# Patient Record
Sex: Female | Born: 1985 | Race: Black or African American | Hispanic: No | Marital: Single | Smoking: Former smoker
Health system: Southern US, Community
[De-identification: ages and names within clinical notes are randomized; demographics above are authoritative.]

## PROBLEM LIST (undated history)

## (undated) ENCOUNTER — Inpatient Hospital Stay (HOSPITAL_COMMUNITY): Payer: Self-pay

## (undated) DIAGNOSIS — F419 Anxiety disorder, unspecified: Secondary | ICD-10-CM

## (undated) DIAGNOSIS — F32A Depression, unspecified: Secondary | ICD-10-CM

## (undated) DIAGNOSIS — D649 Anemia, unspecified: Secondary | ICD-10-CM

## (undated) DIAGNOSIS — F329 Major depressive disorder, single episode, unspecified: Secondary | ICD-10-CM

## (undated) DIAGNOSIS — Z789 Other specified health status: Secondary | ICD-10-CM

## (undated) DIAGNOSIS — N83209 Unspecified ovarian cyst, unspecified side: Secondary | ICD-10-CM

## (undated) HISTORY — PX: HERNIA REPAIR: SHX51

---

## 2010-02-14 ENCOUNTER — Emergency Department (HOSPITAL_COMMUNITY): Admission: EM | Admit: 2010-02-14 | Discharge: 2010-02-14 | Payer: Self-pay | Admitting: Emergency Medicine

## 2010-08-14 ENCOUNTER — Emergency Department (HOSPITAL_COMMUNITY): Admission: EM | Admit: 2010-08-14 | Discharge: 2010-08-15 | Payer: Self-pay | Admitting: Emergency Medicine

## 2010-10-15 ENCOUNTER — Emergency Department (HOSPITAL_COMMUNITY)
Admission: EM | Admit: 2010-10-15 | Discharge: 2010-10-15 | Payer: Self-pay | Source: Home / Self Care | Admitting: Emergency Medicine

## 2010-10-23 ENCOUNTER — Emergency Department (HOSPITAL_COMMUNITY)
Admission: EM | Admit: 2010-10-23 | Discharge: 2010-10-23 | Payer: Self-pay | Source: Home / Self Care | Admitting: Family Medicine

## 2011-01-23 LAB — POCT PREGNANCY, URINE: Preg Test, Ur: NEGATIVE

## 2011-01-29 LAB — COMPREHENSIVE METABOLIC PANEL
ALT: 24 U/L (ref 0–35)
AST: 16 U/L (ref 0–37)
Albumin: 3.6 g/dL (ref 3.5–5.2)
CO2: 27 mEq/L (ref 19–32)
Creatinine, Ser: 0.87 mg/dL (ref 0.4–1.2)
GFR calc non Af Amer: >60 mL/min (ref >60)
Glucose, Bld: 93 mg/dL (ref 70–99)
Potassium: 3.4 mEq/L — ABNORMAL LOW (ref 3.5–5.1)
Total Protein: 7.8 g/dL (ref 6.0–8.3)

## 2011-01-29 LAB — DIFFERENTIAL
Basophils Absolute: 0.1 10*3/uL (ref 0.0–0.1)
Basophils Relative: 1 % (ref 0–1)
Eosinophils Absolute: 0.1 10*3/uL (ref 0.0–0.7)
Neutrophils Relative %: 75 % (ref 43–77)

## 2011-01-29 LAB — CBC
HCT: 38.6 % (ref 36.0–46.0)
Hemoglobin: 12.1 g/dL (ref 12.0–15.0)
MCHC: 31.3 g/dL (ref 30.0–36.0)
MCV: 75.4 fL — ABNORMAL LOW (ref 78.0–100.0)
Platelets: 419 10*3/uL — ABNORMAL HIGH (ref 150–400)
RBC: 5.12 MIL/uL — ABNORMAL HIGH (ref 3.87–5.11)
WBC: 15.3 10*3/uL — ABNORMAL HIGH (ref 4.0–10.5)

## 2011-01-29 LAB — URINALYSIS, ROUTINE W REFLEX MICROSCOPIC
Glucose, UA: NEGATIVE mg/dL
Hgb urine dipstick: NEGATIVE
Nitrite: POSITIVE — AB
pH: 5.5 (ref 5.0–8.0)

## 2012-08-24 ENCOUNTER — Emergency Department (HOSPITAL_COMMUNITY)
Admission: EM | Admit: 2012-08-24 | Discharge: 2012-08-24 | Disposition: A | Discharge status: Home / Self Care | Payer: Medicaid Other | Attending: Emergency Medicine | Admitting: Emergency Medicine

## 2012-08-24 ENCOUNTER — Encounter (HOSPITAL_COMMUNITY): Payer: Self-pay | Admitting: Emergency Medicine

## 2012-08-24 ENCOUNTER — Emergency Department (HOSPITAL_COMMUNITY): Payer: Medicaid Other

## 2012-08-24 DIAGNOSIS — M25561 Pain in right knee: Secondary | ICD-10-CM

## 2012-08-24 DIAGNOSIS — M25569 Pain in unspecified knee: Secondary | ICD-10-CM | POA: Insufficient documentation

## 2012-08-24 MED ORDER — OXYCODONE-ACETAMINOPHEN 5-325 MG PO TABS
2.0000 | ORAL_TABLET | Freq: Once | ORAL | Status: AC
  Administered 2012-08-24: 2 via ORAL
  Filled 2012-08-24: qty 2

## 2012-08-24 MED ORDER — OXYCODONE-ACETAMINOPHEN 5-325 MG PO TABS: 2.0000 | ORAL_TABLET | Freq: Four times a day (QID) | ORAL | Status: DC | PRN

## 2012-08-24 NOTE — ED Notes (Signed)
Pt c/o right knee pain after standing up and hearing pop; pt sts still having pain

## 2012-08-24 NOTE — ED Notes (Signed)
Ortho paged for crutches and immobilizer 

## 2012-08-24 NOTE — Progress Notes (Signed)
Orthopedic Tech Progress Note Patient Details:  Shawna Hunt 11-Oct-1986 [id number]  Ortho Devices Type of Ortho Device: Crutches;Knee Immobilizer Ortho Device/Splint Location: (R) LE Ortho Device/Splint Interventions: Ordered;Application   [name] 08/24/2012, 5:30 PM

## 2012-08-24 NOTE — ED Provider Notes (Signed)
History     CSN: [id number]  Arrival date & time 08/24/12  1426   First MD Initiated Contact with Patient 08/24/12 1628      Chief Complaint  Patient presents with  . Knee Pain    (Consider location/radiation/quality/duration/timing/severity/associated sxs/prior treatment) HPI This 26 year old female yesterday was getting up after sitting on the floor and felt a pop and pain in her right knee. There is no swelling or color change to her right leg but she has pain from the right knees which is severe worse with position changes and radiates towards her right thigh and right lower leg as well. She is no numbness or weakness or color change or swelling to right foot. She is able fully extend the leg against resistance but has decreased flexion due to the pain. She has a feeling like he is going to give way when she tries to stand and walk with a limp. She is no other injury there was no fall. There is no other pain or other concerns today. She is no pain to her neck back chest or abdomen. She is no shortness breath. She is no pain to her left leg her arms. There is no treatment prior to arrival. Her pain is sharp stabbing worse with position changes better she stays still. History reviewed. No pertinent past medical history.  History reviewed. No pertinent past surgical history.  History reviewed. No pertinent family history.  History  Substance Use Topics  . Smoking status: Current Every Day Smoker  . Smokeless tobacco: Not on file  . Alcohol Use: No    OB History    Grav Para Term Preterm Abortions TAB SAB Ect Mult Living                  Review of Systems 10 Systems reviewed and are negative for acute change except as noted in the HPI. Allergies  Review of patient's allergies indicates no known allergies.  Home Medications   Current Outpatient Rx  Name Route Sig Dispense Refill  . OXYCODONE-ACETAMINOPHEN 5-325 MG PO TABS Oral Take 2 tablets by mouth every 6 (six) hours  as needed for pain. 20 tablet 0    BP 102/68  Pulse 107  Temp 98.6 F (37 C) (Oral)  Resp 18  SpO2 98%  LMP 08/10/2012  Physical Exam  Nursing note and vitals reviewed. Constitutional:       Awake, alert, nontoxic appearance.  HENT:  Head: Atraumatic.  Eyes: Right eye exhibits no discharge. Left eye exhibits no discharge.  Neck: Neck supple.  Pulmonary/Chest: Effort normal. She exhibits no tenderness.  Abdominal: Soft. There is no tenderness. There is no rebound.  Musculoskeletal: She exhibits tenderness.       Baseline ROM, no obvious new focal weakness. Both arms and left leg nontender. Cervical spine nontender. Right leg has mild diffuse tenderness to the thigh knee and calf without swelling or deformity noted. Right foot his dorsalis pedis pulse intact. Right foot is normal light touch with capillary refill less than 2 seconds. Right foot is good movement and strength. Right knee has diffuse tenderness with negative patellar apprehension test and the patient is able to extend the knee completely against resistance. Patient has decreased flexion due to pain. Patient has negative Lachman's and no laxity or worsening pain with varus or valgus stress testing. Patient does have abnormal positive McMurray's testing. Based on the history is most suspicious of meniscal tear.  Neurological:       Mental  status and motor strength appears baseline for patient and situation.  Skin: No rash noted.  Psychiatric: She has a normal mood and affect.    ED Course  Procedures (including critical care time)  Labs Reviewed - No data to display No results found.   1. Right knee pain       MDM  Patient / Family / Caregiver informed of clinical course, understand medical decision-making process, and agree with plan.        Hurman Horn, MD 08/28/12 [phone]

## 2012-08-24 NOTE — ED Notes (Signed)
Ortho tech paged for knee immobilizer and crutches 

## 2013-04-27 ENCOUNTER — Emergency Department (HOSPITAL_COMMUNITY)
Admission: EM | Admit: 2013-04-27 | Discharge: 2013-04-28 | Disposition: A | Discharge status: Home / Self Care | Payer: Medicaid Other | Attending: Emergency Medicine | Admitting: Emergency Medicine

## 2013-04-27 ENCOUNTER — Encounter (HOSPITAL_COMMUNITY): Payer: Self-pay

## 2013-04-27 DIAGNOSIS — F17200 Nicotine dependence, unspecified, uncomplicated: Secondary | ICD-10-CM | POA: Insufficient documentation

## 2013-04-27 DIAGNOSIS — M549 Dorsalgia, unspecified: Secondary | ICD-10-CM | POA: Insufficient documentation

## 2013-04-27 DIAGNOSIS — K029 Dental caries, unspecified: Secondary | ICD-10-CM | POA: Insufficient documentation

## 2013-04-27 NOTE — ED Notes (Signed)
Pt presents with dental pain on the right side. Pt called to speak with a dentist and they could not see her for 4 weeks. Pt chipped her tooth last week and has had this pain since then. Pt also c/o upper back pain.

## 2013-04-27 NOTE — ED Provider Notes (Signed)
History     CSN: [id number]  Arrival date & time 04/27/13  2206   None     Chief Complaint  Patient presents with  . Dental Pain  . Back Pain    (Consider location/radiation/quality/duration/timing/severity/associated sxs/prior treatment) HPI Comments: Patient states she has a dentist appointment on July 28 in the meantime, she has a cavity in her right upper first molar that has become very sensitive to change in temperature.  She's been taking over-the-counter Tylenol, ibuprofen, and Advil without relief.  She denies any headache, facial swelling, drainage  Patient is a 27 y.o. female presenting with tooth pain and back pain. The history is provided by the patient.  Dental Pain Location:  Upper Upper teeth location:  3/RU 1st molar Quality:  Lambert Mody Severity:  Moderate Duration:  3 days Timing:  Constant Progression:  Worsening Chronicity:  New Context: dental caries   Relieved by:  Nothing Worsened by:  Cold food/drink Ineffective treatments:  Acetaminophen and NSAIDs Associated symptoms: no facial pain, no facial swelling, no fever, no gum swelling, no headaches, no oral bleeding, no oral lesions and no trismus   Back Pain Associated symptoms: no dysuria, no fever and no headaches     History reviewed. No pertinent past medical history.  History reviewed. No pertinent past surgical history.  No family history on file.  History  Substance Use Topics  . Smoking status: Current Every Day Smoker  . Smokeless tobacco: Not on file  . Alcohol Use: No    OB History   Grav Para Term Preterm Abortions TAB SAB Ect Mult Living                  Review of Systems  Constitutional: Negative for fever.  HENT: Positive for dental problem. Negative for facial swelling, mouth sores and trouble swallowing.   Genitourinary: Negative for dysuria.  Musculoskeletal: Positive for back pain.  Neurological: Negative for headaches.  All other systems reviewed and are  negative.    Allergies  Review of patient's allergies indicates no known allergies.  Home Medications   Current Outpatient Rx  Name  Route  Sig  Dispense  Refill  . acetaminophen (TYLENOL) 500 MG tablet   Oral   Take 1,000 mg by mouth every 6 (six) hours as needed for pain (PAIN).         Marland Kitchen ALPRAZolam (XANAX) 1 MG tablet   Oral   Take 1 mg by mouth 3 (three) times daily as needed for sleep (ANXIETY).           BP 154/99  Pulse 61  Temp(Src) 98.1 F (36.7 C) (Oral)  Resp 22  Ht 5\' 8"  (1.727 m)  Wt 220 lb (99.791 kg)  BMI 33.46 kg/m2  SpO2 98%  LMP 04/01/2013  Physical Exam  Nursing note and vitals reviewed. Constitutional: She appears well-developed and well-nourished.  HENT:  Head: Normocephalic.  Eyes: Pupils are equal, round, and reactive to light.  Neck: Normal range of motion.  Cardiovascular: Normal rate.   Pulmonary/Chest: Effort normal and breath sounds normal.  Musculoskeletal: Normal range of motion.  Lymphadenopathy:    She has no cervical adenopathy.  Neurological: She is alert.  Skin: Skin is warm and dry.    ED Course  Procedures (including critical care time)  Labs Reviewed - No data to display No results found.   1. Dental cavity       MDM  Patient was given a prescription for Ultram.  She does have  a dentist appointment on July 20 recommended that she use Anbesol and temporary filling or dental wax         Arman Filter, NP 04/27/13 2358

## 2013-04-28 MED ORDER — TRAMADOL HCL 50 MG PO TABS: 50.0000 mg | ORAL_TABLET | Freq: Four times a day (QID) | ORAL | Status: DC | PRN

## 2013-04-28 MED ORDER — TRAMADOL HCL 50 MG PO TABS
50.0000 mg | ORAL_TABLET | Freq: Once | ORAL | Status: AC
  Administered 2013-04-28: 50 mg via ORAL
  Filled 2013-04-28: qty 1

## 2013-04-28 NOTE — ED Notes (Signed)
Pt asking for 2nd MD opinion.  Spoke to Dr. Norlene Campbell.  She will assess pt soon.

## 2013-04-28 NOTE — ED Notes (Signed)
[id number]<WG> Expected date:<BR> Expected time:<BR> Means of arrival:<BR> Comments:<BR> Shawna Hunt

## 2013-04-28 NOTE — ED Provider Notes (Signed)
Medical screening examination/treatment/procedure(s) were performed by non-physician practitioner and as supervising physician I was immediately available for consultation/collaboration.  Luceal Hollibaugh M Dovey Fatzinger, MD 04/28/13 0407 

## 2013-10-21 ENCOUNTER — Emergency Department (HOSPITAL_COMMUNITY)
Admission: EM | Admit: 2013-10-21 | Discharge: 2013-10-21 | Disposition: A | Discharge status: Home / Self Care | Payer: Medicare Other | Attending: Emergency Medicine | Admitting: Emergency Medicine

## 2013-10-21 ENCOUNTER — Encounter (HOSPITAL_COMMUNITY): Payer: Self-pay | Admitting: Emergency Medicine

## 2013-10-21 DIAGNOSIS — R1110 Vomiting, unspecified: Secondary | ICD-10-CM | POA: Insufficient documentation

## 2013-10-21 DIAGNOSIS — J069 Acute upper respiratory infection, unspecified: Secondary | ICD-10-CM

## 2013-10-21 DIAGNOSIS — IMO0001 Reserved for inherently not codable concepts without codable children: Secondary | ICD-10-CM | POA: Insufficient documentation

## 2013-10-21 DIAGNOSIS — F17200 Nicotine dependence, unspecified, uncomplicated: Secondary | ICD-10-CM | POA: Insufficient documentation

## 2013-10-21 MED ORDER — KETOROLAC TROMETHAMINE 30 MG/ML IJ SOLN
30.0000 mg | Freq: Once | INTRAMUSCULAR | Status: AC
  Administered 2013-10-21: 30 mg via INTRAMUSCULAR
  Filled 2013-10-21: qty 1

## 2013-10-21 MED ORDER — DM-GUAIFENESIN ER 30-600 MG PO TB12
1.0000 | ORAL_TABLET | Freq: Two times a day (BID) | ORAL | Status: DC
  Administered 2013-10-21: 1 via ORAL
  Filled 2013-10-21 (×2): qty 1

## 2013-10-21 MED ORDER — KETOROLAC TROMETHAMINE 30 MG/ML IJ SOLN: 30.0000 mg | Freq: Once | INTRAMUSCULAR | Status: DC

## 2013-10-21 NOTE — ED Notes (Signed)
Pt c/o URI sx with cough and pain with cough x 2 days

## 2013-10-21 NOTE — ED Provider Notes (Signed)
CSN: [id number]     Arrival date & time 10/21/13  7829 History   First MD Initiated Contact with Patient 10/21/13 0944    This chart was scribed for Mellody Drown PA-C, a non-physician practitioner working with Laray Anger, DO by Lewanda Rife, ED Scribe. This patient was seen in room TR08C/TR08C and the patient's care was started at 10:25 AM     Chief Complaint  Patient presents with  . URI  . Cough   (Consider location/radiation/quality/duration/timing/severity/associated sxs/prior Treatment) The history is provided by the patient. No language interpreter was used.  HPI Comments: Shawna Hunt is a 27 y.o. female who presents to the Emergency Department complaining of occasional productive cough with phelghm onset 2 days. Describes cough as worsening in severity. Reports associated throat discomfort with cough, and generalized myalgias. She also reports a recent "GI bug" which she reported she vomited several times over 2 days. Denies any aggravating or alleviating factors. Denies associated fever, otalgia, sore throat, and shortness of breath. Denies trying any medications to relieve symptoms. Reports she smokes cigarette.      History reviewed. No pertinent past medical history. History reviewed. No pertinent past surgical history. History reviewed. No pertinent family history. History  Substance Use Topics  . Smoking status: Current Every Day Smoker  . Smokeless tobacco: Not on file  . Alcohol Use: Yes   OB History   Grav Para Term Preterm Abortions TAB SAB Ect Mult Living                 Review of Systems  Constitutional: Negative for fever and chills.  HENT: Positive for rhinorrhea and sore throat. Negative for ear pain.   Respiratory: Positive for cough. Negative for chest tightness and wheezing.   Gastrointestinal: Positive for vomiting. Negative for abdominal pain and diarrhea.  Musculoskeletal: Positive for myalgias.  Neurological: Positive for headaches.   Psychiatric/Behavioral: Negative for confusion.  All other systems reviewed and are negative.   A complete 10 system review of systems was obtained and all systems are negative except as noted in the HPI and PMHx.    Allergies  Review of patient's allergies indicates no known allergies.  Home Medications  No current outpatient prescriptions on file. BP 106/68  Pulse 62  Temp(Src) 98.5 F (36.9 C) (Oral)  Resp 18  SpO2 96% Physical Exam  Nursing note and vitals reviewed. Constitutional: She is oriented to person, place, and time. She appears well-developed and well-nourished. No distress.  HENT:  Head: Normocephalic and atraumatic.  Right Ear: Tympanic membrane, external ear and ear canal normal.  Left Ear: Tympanic membrane, external ear and ear canal normal.  Nose: Mucosal edema and rhinorrhea present. Right sinus exhibits no maxillary sinus tenderness and no frontal sinus tenderness. Left sinus exhibits no maxillary sinus tenderness and no frontal sinus tenderness.  Mouth/Throat: Uvula is midline and mucous membranes are normal. Mucous membranes are not dry. No oral lesions. No trismus in the jaw. Posterior oropharyngeal erythema present. No oropharyngeal exudate.  Eyes: Conjunctivae are normal. Right eye exhibits no discharge. Left eye exhibits no discharge.  Neck: Neck supple.  Cardiovascular: Normal rate and regular rhythm.   No murmur heard. Pulmonary/Chest: Effort normal and breath sounds normal. No respiratory distress. She has no wheezes. She has no rales.  Abdominal: Soft. There is no tenderness. There is no rebound and no guarding.  Musculoskeletal: Normal range of motion.  Neurological: She is alert and oriented to person, place, and time.  Skin: Skin is  warm and dry.  Psychiatric: She has a normal mood and affect. Her behavior is normal.    ED Course  Procedures   COORDINATION OF CARE:  Nursing notes reviewed. Vital signs reviewed. Initial pt interview and  examination performed.    Treatment plan initiated:Medications - No data to display   Initial diagnostic testing ordered.    Labs Review Labs Reviewed - No data to display Imaging Review No results found.  EKG Interpretation   None       MDM   1. Viral upper respiratory illness    Pt presents with cough and congestion for 2 days, most likely viral. Reports headache, most likely due to vomiting and sinus preasure. Discussed treatment plan with the patient. Return precautions given. She reports understanding and no other concerns at this time.   Patient is stable for discharge at this time.  Meds given in ED:  Medications  ketorolac (TORADOL) 30 MG/ML injection 30 mg (30 mg Intramuscular Given 10/21/13 1112)    There are no discharge medications for this patient.    I personally performed the services described in this documentation, which was scribed in my presence. The recorded information has been reviewed and is accurate.     Clabe Seal, PA-C 10/22/13 2135

## 2013-10-24 NOTE — ED Provider Notes (Signed)
Medical screening examination/treatment/procedure(s) were performed by non-physician practitioner and as supervising physician I was immediately available for consultation/collaboration.  EKG Interpretation   None         Laray Anger, DO 10/24/13 [phone]

## 2013-10-28 ENCOUNTER — Emergency Department (HOSPITAL_COMMUNITY)
Admission: EM | Admit: 2013-10-28 | Discharge: 2013-10-28 | Disposition: A | Discharge status: Home / Self Care | Payer: Medicare Other | Attending: Emergency Medicine | Admitting: Emergency Medicine

## 2013-10-28 ENCOUNTER — Emergency Department (HOSPITAL_COMMUNITY): Payer: Medicare Other

## 2013-10-28 ENCOUNTER — Encounter (HOSPITAL_COMMUNITY): Payer: Self-pay | Admitting: Emergency Medicine

## 2013-10-28 DIAGNOSIS — Y929 Unspecified place or not applicable: Secondary | ICD-10-CM | POA: Insufficient documentation

## 2013-10-28 DIAGNOSIS — W010XXA Fall on same level from slipping, tripping and stumbling without subsequent striking against object, initial encounter: Secondary | ICD-10-CM | POA: Insufficient documentation

## 2013-10-28 DIAGNOSIS — M25569 Pain in unspecified knee: Secondary | ICD-10-CM | POA: Insufficient documentation

## 2013-10-28 DIAGNOSIS — M25561 Pain in right knee: Secondary | ICD-10-CM

## 2013-10-28 DIAGNOSIS — Y939 Activity, unspecified: Secondary | ICD-10-CM | POA: Insufficient documentation

## 2013-10-28 DIAGNOSIS — R200 Anesthesia of skin: Secondary | ICD-10-CM

## 2013-10-28 DIAGNOSIS — R209 Unspecified disturbances of skin sensation: Secondary | ICD-10-CM | POA: Insufficient documentation

## 2013-10-28 DIAGNOSIS — F17200 Nicotine dependence, unspecified, uncomplicated: Secondary | ICD-10-CM | POA: Insufficient documentation

## 2013-10-28 DIAGNOSIS — M25469 Effusion, unspecified knee: Secondary | ICD-10-CM | POA: Insufficient documentation

## 2013-10-28 DIAGNOSIS — X500XXA Overexertion from strenuous movement or load, initial encounter: Secondary | ICD-10-CM | POA: Insufficient documentation

## 2013-10-28 MED ORDER — IBUPROFEN 800 MG PO TABS: 800.0000 mg | ORAL_TABLET | Freq: Three times a day (TID) | ORAL | Status: DC

## 2013-10-28 MED ORDER — HYDROCODONE-ACETAMINOPHEN 5-325 MG PO TABS: 2.0000 | ORAL_TABLET | ORAL | Status: DC | PRN

## 2013-10-28 MED ORDER — OXYCODONE-ACETAMINOPHEN 5-325 MG PO TABS
2.0000 | ORAL_TABLET | Freq: Once | ORAL | Status: AC
  Administered 2013-10-28: 2 via ORAL
  Filled 2013-10-28: qty 2

## 2013-10-28 NOTE — Progress Notes (Signed)
Orthopedic Tech Progress Note Patient Details:  Shawna Hunt [birth year] [id number]  Ortho Devices Type of Ortho Device: Knee Immobilizer;Crutches Ortho Device/Splint Location: RLE Ortho Device/Splint Interventions: Ordered;Application   [name] 10/28/2013, 9:08 PM
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
## 2013-10-28 NOTE — ED Provider Notes (Signed)
Medical screening examination/treatment/procedure(s) were performed by non-physician practitioner and as supervising physician I was immediately available for consultation/collaboration.  EKG Interpretation   None         Ellis Koffler N Sadat Sliwa, DO 10/28/13 2329 
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
## 2014-03-18 ENCOUNTER — Encounter (HOSPITAL_COMMUNITY): Payer: Self-pay | Admitting: Emergency Medicine

## 2014-03-18 DIAGNOSIS — S1093XA Contusion of unspecified part of neck, initial encounter: Secondary | ICD-10-CM

## 2014-03-18 DIAGNOSIS — R42 Dizziness and giddiness: Secondary | ICD-10-CM | POA: Insufficient documentation

## 2014-03-18 DIAGNOSIS — S0003XA Contusion of scalp, initial encounter: Secondary | ICD-10-CM | POA: Insufficient documentation

## 2014-03-18 DIAGNOSIS — S0083XA Contusion of other part of head, initial encounter: Secondary | ICD-10-CM | POA: Insufficient documentation

## 2014-03-18 DIAGNOSIS — S0990XA Unspecified injury of head, initial encounter: Secondary | ICD-10-CM | POA: Insufficient documentation
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
## 2014-03-19 ENCOUNTER — Encounter (HOSPITAL_COMMUNITY): Payer: Self-pay | Admitting: Emergency Medicine

## 2014-03-19 ENCOUNTER — Emergency Department (HOSPITAL_COMMUNITY): Payer: Medicare Other

## 2014-03-19 ENCOUNTER — Emergency Department (HOSPITAL_COMMUNITY)
Admission: EM | Admit: 2014-03-19 | Discharge: 2014-03-19 | Discharge status: Against Medical Advice | Payer: Medicare Other | Attending: Emergency Medicine | Admitting: Emergency Medicine

## 2014-03-19 ENCOUNTER — Emergency Department (HOSPITAL_COMMUNITY)
Admission: EM | Admit: 2014-03-19 | Discharge: 2014-03-19 | Disposition: A | Discharge status: Home / Self Care | Payer: Medicare Other | Attending: Emergency Medicine | Admitting: Emergency Medicine

## 2014-03-19 DIAGNOSIS — R519 Headache, unspecified: Secondary | ICD-10-CM

## 2014-03-19 DIAGNOSIS — S0990XA Unspecified injury of head, initial encounter: Secondary | ICD-10-CM | POA: Insufficient documentation

## 2014-03-19 DIAGNOSIS — Z87891 Personal history of nicotine dependence: Secondary | ICD-10-CM | POA: Insufficient documentation

## 2014-03-19 DIAGNOSIS — R51 Headache: Secondary | ICD-10-CM

## 2014-03-19 MED ORDER — PROCHLORPERAZINE MALEATE 10 MG PO TABS
10.0000 mg | ORAL_TABLET | Freq: Once | ORAL | Status: AC
  Administered 2014-03-19: 10 mg via ORAL
  Filled 2014-03-19: qty 1

## 2014-03-19 MED ORDER — KETOROLAC TROMETHAMINE 60 MG/2ML IM SOLN
60.0000 mg | Freq: Once | INTRAMUSCULAR | Status: AC
  Administered 2014-03-19: 60 mg via INTRAMUSCULAR
  Filled 2014-03-19: qty 2

## 2014-03-19 MED ORDER — DIPHENHYDRAMINE HCL 25 MG PO CAPS
25.0000 mg | ORAL_CAPSULE | Freq: Once | ORAL | Status: AC
  Administered 2014-03-19: 25 mg via ORAL
  Filled 2014-03-19: qty 1

## 2014-03-19 NOTE — ED Notes (Signed)
[employer] at bedside 

## 2014-03-19 NOTE — ED Notes (Signed)
Called for pt. But no response. Will follow-up.
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
## 2014-03-19 NOTE — ED Notes (Signed)
Called for pt. And no response for 2 nd time.
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
## 2014-03-19 NOTE — Discharge Instructions (Signed)
You are having a headache. No specific cause was found today for your headache. It may have been a migraine or other cause of headache. Stress, anxiety, fatigue, and depression are common triggers for headaches. Your headache today does not appear to be life-threatening or require hospitalization, but often the exact cause of headaches is not determined in the emergency department. Therefore, follow-up with your doctor is very important to find out what may have caused your headache, and whether or not you need any further diagnostic testing or treatment. Sometimes headaches can appear benign (not harmful), but then more serious symptoms can develop which should prompt an immediate re-evaluation by your doctor or the emergency department. SEEK MEDICAL ATTENTION IF: You develop possible problems with medications prescribed.  The medications don't resolve your headache, if it recurs , or if you have multiple episodes of vomiting or can't take fluids. You have a change from the usual headache. RETURN IMMEDIATELY IF you develop a sudden, severe headache or confusion, become poorly responsive or faint, develop a fever above 100.41F or problem breathing, have a change in speech, vision, swallowing, or understanding, or develop new weakness, numbness, tingling, incoordination, or have a seizure.  Assault, General Assault includes any behavior, whether intentional or reckless, which results in bodily injury to another person and/or damage to property. Included in this would be any behavior, intentional or reckless, that by its nature would be understood (interpreted) by a reasonable person as intent to harm another person or to damage his/her property. Threats may be oral or written. They may be communicated through regular mail, computer, fax, or phone. These threats may be direct or implied. FORMS OF ASSAULT INCLUDE:  Physically assaulting a person. This includes physical threats to inflict physical harm as well  as:  Slapping.  Hitting.  Poking.  Kicking.  Punching.  Pushing.  Arson.  Sabotage.  Equipment vandalism.  Damaging or destroying property.  Throwing or hitting objects.  Displaying a weapon or an object that appears to be a weapon in a threatening manner.  Carrying a firearm of any kind.  Using a weapon to harm someone.  Using greater physical size/strength to intimidate another.  Making intimidating or threatening gestures.  Bullying.  Hazing.  Intimidating, threatening, hostile, or abusive language directed toward another person.  It communicates the intention to engage in violence against that person. And it leads a reasonable person to expect that violent behavior may occur.  Stalking another person. IF IT HAPPENS AGAIN:  Immediately call for emergency help (911 in [place]  If someone poses clear and immediate danger to you, seek legal authorities to have a protective or restraining order put in place.  Less threatening assaults can at least be reported to authorities. STEPS TO TAKE IF A SEXUAL ASSAULT HAS HAPPENED  Go to an area of safety. This may include a shelter or staying with a friend. Stay away from the area where you have been attacked. A large percentage of sexual assaults are caused by a friend, relative or associate.  If medications were given by your caregiver, take them as directed for the full length of time prescribed.  Only take over-the-counter or prescription medicines for pain, discomfort, or fever as directed by your caregiver.  If you have come in contact with a sexual disease, find out if you are to be tested again. If your caregiver is concerned about the HIV/AIDS virus, he/she may require you to have continued testing for several months.  For the protection of  your privacy, test results can not be given over the phone. Make sure you receive the results of your test. If your test results are not back during your visit, make an  appointment with your caregiver to find out the results. Do not assume everything is normal if you have not heard from your caregiver or the medical facility. It is important for you to follow up on all of your test results.  File appropriate papers with authorities. This is important in all assaults, even if it has occurred in a family or by a friend. SEEK MEDICAL CARE IF:  You have new problems because of your injuries.  You have problems that may be because of the medicine you are taking, such as:  Rash.  Itching.  Swelling.  Trouble breathing.  You develop belly (abdominal) pain, feel sick to your stomach (nausea) or are vomiting.  You begin to run a temperature.  You need supportive care or referral to a rape crisis center. These are centers with trained personnel who can help you get through this ordeal. SEEK IMMEDIATE MEDICAL CARE IF:  You are afraid of being threatened, beaten, or abused. In [place]., call 911.  You receive new injuries related to abuse.  You develop severe pain in any area injured in the assault or have any change in your condition that concerns you.  You faint or lose consciousness.  You develop chest pain or shortness of breath. Document Released: 10/27/2005 Document Revised: 01/19/2012 Document Reviewed: 06/14/2008 [facility] Patient Information 2014 [employer] [place].       Emergency Department Resource Guide 1) Find a Doctor and Pay Out of Pocket Although you won't have to find out who is covered by your insurance plan, it is a good idea to ask around and get recommendations. You will then need to call the office and see if the doctor you have chosen will accept you as a new patient and what types of options they offer for patients who are self-pay. Some doctors offer discounts or will set up payment plans for their patients who do not have insurance, but you will need to ask so you aren't surprised when you get to your appointment.  2) Contact Your  Local Health Department Not all health departments have doctors that can see patients for sick visits, but many do, so it is worth a call to see if yours does. If you don't know where your local health department is, you can check in your phone book. The CDC also has a tool to help you locate your state's health department, and many state websites also have listings of all of their local health departments.  3) Find a Walk-in Clinic If your illness is not likely to be very severe or complicated, you may want to try a walk in clinic. These are popping up all over the country in pharmacies, drugstores, and shopping centers. They're usually staffed by nurse practitioners or physician assistants that have been trained to treat common illnesses and complaints. They're usually fairly quick and inexpensive. However, if you have serious medical issues or chronic medical problems, these are probably not your best option.  No Primary Care Doctor: - Call Health Connect at  [phone] - they can help you locate a primary care doctor that  accepts your insurance, provides certain services, etc. - Physician Referral Service- [phone]  Chronic Pain Problems: Organization         Address  Phone   Notes  [facility]  [phone] Patients need to be referred by their primary care doctor.   Medication Assistance: Organization         Address  Phone   Notes  [facility] Medication [facility] [address] [place], [place] [address] [phone] --Must be a resident of [facility] -- Must have NO insurance coverage whatsoever (no Medicaid/ Medicare, etc.) -- The pt. MUST have a primary care doctor that directs their care regularly and follows them in the community   [facility]ist  [phone]   [employer]  [phone]    Agencies that provide inexpensive medical care: Organization         Address  Phone   Notes  [facility]  [phone]   [facility]    [phone]   [facility] [address] [place], [place] [address] [phone]   [facility] [address] [place], [place] [phone]   Planned Parenthood    [phone]   [facility]    [phone]   [facility] and [facility]  [address], [place] Phone:  [phone], Fax:  [phone] Hours of Operation:  9 am - 6 pm, M-F.  Also accepts Medicaid/Medicare and self-pay.  [facility]  [address], [place] Phone: [phone], Fax: [phone]. Hours of Operation:  8:30 am - 5:30 pm, M-F.  Also accepts Medicaid and self-pay.  [facility] [place] [address] [place] Phone: [phone]   [facility] [address] Natasha Bence [place], [place] [phone], Ext. [phone] Mondays & Thursdays: 7-9 AM.  First 15 patients are seen on a first come, first serve basis.    Medicaid-accepting [facility] Providers:  Organization         Address  Phone   Notes  [facility] [address], [place] [phone] Also accepts self-pay patients.  [facility] [address] Laurell Josephs [place], [place]  [phone]   [facility] [address], [place] [phone]   [facility] [address], [place] [phone]   Renaye Rakers [address], [place]   [phone] Only accepts [place] Access [place] patients after they have their name applied to their card.   Self-Pay (no insurance) in [facility]:  Organization         Address  Phone   Notes  Sickle Cell Patients, [facility] Internal Medicine [address] [place], [place] [phone]   [facility] [address] [place], [place] [phone]   [facility] [place]  [address]  [address],  [phone]   [facility]/Dr. Osei-Bonsu  [address] [place] or [address], [place] [phone] Phone number for both [place] and [place] locations is the same.  [facility] and [facility] [address] [place] [address] [place] [phone]   [facility] [address], [place] or [address] [phone]   [facility] [address], [place] [phone], phone; [phone], fax Sees patients 1st and 3rd Saturday of every month.  Must not qualify for public or private insurance (i.e. Medicaid, Medicare, [place] Health Choice,  Veterans' Benefits)  Household income should be no more than 200% of the poverty level The clinic cannot treat you if you are pregnant or think you are pregnant  Sexually transmitted diseases are not treated at the clinic.    Dental Care: Organization         Address  Phone  Notes  [facility] Department of [facility] [address] [place], [place] [phone] Accepts children up to age 52 who are enrolled in [place] or [place] Health Choice; pregnant women with a Medicaid card; and children who have applied for Medicaid or [place] Health Choice, but were declined, whose parents can pay a reduced fee at time of service.  [facility] Department of [facility]  [address], [place] [phone] Accepts children up to age 52 who are enrolled in [place] or [place] Health Choice; pregnant women with a Medicaid card; and children who have applied for Medicaid or  Health Choice, but were declined, whose parents can pay a reduced fee at time of service.  [facility]  [address] [place], [place] [phone] Patients are seen by appointment only. Walk-ins are not accepted. [facility] will see patients 55 years of age and older. Monday - Tuesday (8am-5pm) Most  Wednesdays (8:30-5pm) $30 per visit, cash only  [facility] Adult Dental Access PRO[facility]  [address], [facility] [phone] Patients are seen by appointment only. Walk-ins are not accepted. [facility] will see patients 6 years of age and older. One Wednesday Evening (Monthly: Volunteer Based).  $30 per visit, cash only  [employer]  [phone] for adults; Children under age 5, call Graduate Pediatric Dentistry at [phone]. Children aged 88-14, please call [phone] to request a pediatric application.  Dental services are provided in all areas of dental care including fillings, crowns and bridges, complete and partial dentures, implants, gum treatment, root canals, and extractions. Preventive care is also provided. Treatment is provided to both adults and children. Patients are selected via a lottery and there is often a waiting list.   [facility] [address], [place]  [phone] [email or web address]   [facility] [address] [place], [place] [phone], Ext. [phone] Second and Fourth Thursday of each month, opens at 6:30 AM; Clinic ends at 9 AM.  Patients are seen on a first-come first-served basis, and a limited number are seen during each clinic.   [facility]  [address] Ether Griffins [place], [place] [phone]   Eligibility Requirements You must have lived in [place], [place], or [place] counties for at least the last three months.   You cannot be eligible for state or federal sponsored [place], including [employer], [place], or [employer].   You generally cannot be eligible for healthcare insurance through your employer.    How to apply: Eligibility screenings are held every Tuesday and Wednesday afternoon from 1:00 pm until 4:00 pm. You do not need an appointment for the interview!  [facility] [address] [place], [place] [phone]     [facility] Health Department  [phone]   [facility] Health Department  [phone]   [facility] Health Department  [phone]    Behavioral Health Resources in the Community: Intensive Outpatient Programs Organization         Address  Phone  Notes  [facility] [address], [place], [place] [phone]   [facility] Outpatient [address] [place], [place] [phone]   [facility] [address] [place], [place]  [phone]   [facility] [address]  [place], [place] [phone] or [phone]   Substance Abuse Resources Organization         Address  Phone  Notes  [facility]  [phone]   [facility]  [phone]   [facility]  [phone]   Floydene Flock  [phone]   Residential & Outpatient Substance Abuse Program  [phone]   Psychological Services Organization         Address  Phone  Notes  [facility] Behavioral Health  [phone]   [facility]  [phone]   [facility] Mental Health [address], [place] [phone] or [phone]    Mobile Crisis Teams Organization         Address  Phone  Notes  [facility], Mobile Crisis Care Unit  [phone]   [facility]  [address] [place], [place] [phone]   Doristine Locks [address] [place] [phone]    Self-Help/Support Groups Organization         Address  Phone             Notes  [facility] [place] - variety of support groups  336- I7437963 Call for more information  Narcotics Anonymous (NA), [facility] [address], [employer] [place]  2 meetings at this location   Statistician         Address  Phone  Notes  [facility] [address],    [place]  [phone]   [facility]  [address], [place]  [address], [place], [place] [phone]   [facility] [address] [place], [place] [phone] Admissions: 8am-3pm M-F  [facility] [address]    [place], [place] [phone]   [facility] [address] [place], [place], [place] [phone]   [facility] [address]  [place], [place] [phone]   [facility] - Intensive Outpatient [address] Laure[address] Josephs [address], [place], [place] [phone]   [place] ([facility] [address] [place]  [place], [place] [phone] or [phone]   [facility] ([facility]) [address], [place], [place] [phone] Accepts Medicaid  [facility] [place] [address],  [place] [phone] Substance Abuse/Addiction Treatment   [facility] Organization         Address  Phone  Notes  [facility]  [phone]   Angie Fava, PhD [address] Ervin Knack [place], [place]   [phone] or [phone]   [facility] Behavioral   [address] [place], [place] [phone]   [facility] [address] [place], [place] [phone] Insurance/Medicaid/sponsorship through [facility] and Families [address]                                    [place], [place] [phone] Therapy/tele-psych/case  [facility] [address], [place] [phone]    Dr. Lolly Mustache  [phone]   [facility] [place]  [employer] [facility] Dept. 1) [address]  2) [place] 3)  [place], [place] [phone]  [phone]   [facility] Child Abuse Hotline 210-597-2012 or [phone] (After Hours)

## 2014-03-28 NOTE — ED Provider Notes (Signed)
Medical screening examination/treatment/procedure(s) were performed by non-physician practitioner and as supervising physician I was immediately available for consultation/collaboration.   EKG Interpretation None      Reisa Coppola, MD, FACEP   Lewellyn Fultz L Clorine Swing, MD 03/28/14 1305 

## 2014-08-30 ENCOUNTER — Emergency Department (HOSPITAL_COMMUNITY)
Admission: EM | Admit: 2014-08-30 | Discharge: 2014-08-30 | Disposition: A | Discharge status: Home / Self Care | Payer: Medicare Other | Attending: Emergency Medicine | Admitting: Emergency Medicine

## 2014-08-30 ENCOUNTER — Encounter (HOSPITAL_COMMUNITY): Payer: Self-pay | Admitting: Emergency Medicine

## 2014-08-30 DIAGNOSIS — H6092 Unspecified otitis externa, left ear: Secondary | ICD-10-CM | POA: Insufficient documentation

## 2014-08-30 DIAGNOSIS — Z87891 Personal history of nicotine dependence: Secondary | ICD-10-CM | POA: Diagnosis not present

## 2014-08-30 DIAGNOSIS — H9202 Otalgia, left ear: Secondary | ICD-10-CM | POA: Diagnosis present

## 2014-08-30 MED ORDER — NEOMYCIN-POLYMYXIN-HC 3.5-10000-1 OT SUSP: 4.0000 [drp] | Freq: Four times a day (QID) | OTIC | Status: DC

## 2014-08-30 MED ORDER — AMOXICILLIN 500 MG PO CAPS: 500.0000 mg | ORAL_CAPSULE | Freq: Three times a day (TID) | ORAL | Status: DC

## 2014-08-30 MED ORDER — OXYCODONE-ACETAMINOPHEN 5-325 MG PO TABS: 2.0000 | ORAL_TABLET | ORAL | Status: DC | PRN

## 2014-08-30 NOTE — Discharge Instructions (Signed)
Otitis Media Otitis media is redness, soreness, and inflammation of the middle ear. Otitis media may be caused by allergies or, most commonly, by infection. Often it occurs as a complication of the common cold. SIGNS AND SYMPTOMS Symptoms of otitis media may include:  Earache.  Fever.  Ringing in your ear.  Headache.  Leakage of fluid from the ear. DIAGNOSIS To diagnose otitis media, your health care provider will examine your ear with an otoscope. This is an instrument that allows your health care provider to see into your ear in order to examine your eardrum. Your health care provider also will ask you questions about your symptoms. TREATMENT  Typically, otitis media resolves on its own within 3-5 days. Your health care provider may prescribe medicine to ease your symptoms of pain. If otitis media does not resolve within 5 days or is recurrent, your health care provider may prescribe antibiotic medicines if he or she suspects that a bacterial infection is the cause. HOME CARE INSTRUCTIONS   If you were prescribed an antibiotic medicine, finish it all even if you start to feel better.  Take medicines only as directed by your health care provider.  Keep all follow-up visits as directed by your health care provider. SEEK MEDICAL CARE IF:  You have otitis media only in one ear, or bleeding from your nose, or both.  You notice a lump on your neck.  You are not getting better in 3-5 days.  You feel worse instead of better. SEEK IMMEDIATE MEDICAL CARE IF:   You have pain that is not controlled with medicine.  You have swelling, redness, or pain around your ear or stiffness in your neck.  You notice that part of your face is paralyzed.  You notice that the bone behind your ear (mastoid) is tender when you touch it. MAKE SURE YOU:   Understand these instructions.  Will watch your condition.  Will get help right away if you are not doing well or get worse. Document Released:  08/01/2004 Document Revised: 03/13/2014 Document Reviewed: 05/24/2013 ExitCare Patient Information 2015 [employer]. This information is not intended to replace advice given to you by your health care provider. Make sure you discuss any questions you have with your health care provider. Otitis Externa Otitis externa is a bacterial or fungal infection of the outer ear canal. This is the area from the eardrum to the outside of the ear. Otitis externa is sometimes called "swimmer's ear." CAUSES  Possible causes of infection include:  Swimming in dirty water.  Moisture remaining in the ear after swimming or bathing.  Mild injury (trauma) to the ear.  Objects stuck in the ear (foreign body).  Cuts or scrapes (abrasions) on the outside of the ear. SIGNS AND SYMPTOMS  The first symptom of infection is often itching in the ear canal. Later signs and symptoms may include swelling and redness of the ear canal, ear pain, and yellowish-white fluid (pus) coming from the ear. The ear pain may be worse when pulling on the earlobe. DIAGNOSIS  Your health care provider will perform a physical exam. A sample of fluid may be taken from the ear and examined for bacteria or fungi. TREATMENT  Antibiotic ear drops are often given for 10 to 14 days. Treatment may also include pain medicine or corticosteroids to reduce itching and swelling. HOME CARE INSTRUCTIONS   Apply antibiotic ear drops to the ear canal as prescribed by your health care provider.  Take medicines only as directed by   your health care provider.  If you have diabetes, follow any additional treatment instructions from your health care provider.  Keep all follow-up visits as directed by your health care provider. PREVENTION   Keep your ear dry. Use the corner of a towel to absorb water out of the ear canal after swimming or bathing.  Avoid scratching or putting objects inside your ear. This can damage the ear canal or remove the  protective wax that lines the canal. This makes it easier for bacteria and fungi to grow.  Avoid swimming in lakes, polluted water, or poorly chlorinated pools.  You may use ear drops made of rubbing alcohol and vinegar after swimming. Combine equal parts of white vinegar and alcohol in a bottle. Put 3 or 4 drops into each ear after swimming. SEEK MEDICAL CARE IF:   You have a fever.  Your ear is still red, swollen, painful, or draining pus after 3 days.  Your redness, swelling, or pain gets worse.  You have a severe headache.  You have redness, swelling, pain, or tenderness in the area behind your ear. MAKE SURE YOU:   Understand these instructions.  Will watch your condition.  Will get help right away if you are not doing well or get worse. Document Released: 10/27/2005 Document Revised: 03/13/2014 Document Reviewed: 11/13/2011 Exit[employer] Patient Information 2015 [employer]. This information is not intended to replace advice given to you by your health care provider. Make sure you discuss any questions you have with your health care provider.  

## 2014-08-30 NOTE — ED Notes (Signed)
Pt. reports left ear ache with bloody drainage onset 2 days ago , denies injury / no hearing loss.

## 2014-08-30 NOTE — ED Provider Notes (Signed)
CSN: [id number]     Arrival date & time 08/30/14  2217 History   First [id number] Initiated Contact with Patient 08/30/14 2303     This chart was scribed for non-physician practitioner, Cheron SchaumannLeslie Keyarah Mcroy PA-C working with Shawna SproutWhitney Plunkett, MD by Arlan OrganAshley Leger, ED Scribe. This patient was seen in room TR08C/TR08C and the patient's care was started at 11:05 PM.   Chief Complaint  Patient presents with  . Otalgia   HPI  HPI Comments: Shawna Hunt is a 28 y.o. female who presents to the Emergency Department complaining of constant, moderate L sided otalgia x 2 days.  No recent injury or trauma. Pt reports noting blood drainage from her ear recently. She has tried OTC ear drops without any improvement for symptoms. Ms. [name] denies any fever or chills at this time. She admits to smoking occasionally with plans to quit. No known allergies to medications. No other concerns this visit.  History reviewed. No pertinent past medical history. Past Surgical History  Procedure Laterality Date  . Cesarean section      two  . Hernia repair     No family history on file. History  Substance Use Topics  . Smoking status: Former Smoker    Quit date: 12/19/2013  . Smokeless tobacco: Not on file  . Alcohol Use: Yes   OB History   Grav Para Term Preterm Abortions TAB SAB Ect Mult Living   2    0     2     Review of Systems  Constitutional: Negative for fever and chills.  HENT: Positive for ear pain.   All other systems reviewed and are negative.     Allergies  Review of patient's allergies indicates no known allergies.  Home Medications   Prior to Admission medications   Medication Sig Start Date End Date Taking? Authorizing Provider  Homeopathic Products (EARACHE DROPS) SOLN Place 5 drops in ear(s) daily as needed (for ear pain).   Yes Historical Provider, MD   Triage Vitals: BP 122/59  Pulse 68  Temp(Src) 99 F (37.2 C) (Oral)  Resp 18  SpO2 99%  LMP 07/30/2014   Physical Exam  Nursing  note and vitals reviewed. Constitutional: She is oriented to person, place, and time. She appears well-developed and well-nourished.  HENT:  Head: Normocephalic.  Right Ear: Hearing, tympanic membrane, external ear and ear canal normal.  Left Ear: Tympanic membrane is injected, erythematous and bulging.  Eyes: EOM are normal.  Neck: Normal range of motion.  Cardiovascular: Normal rate, regular rhythm and normal heart sounds.   Pulmonary/Chest: Effort normal and breath sounds normal.  Abdominal: She exhibits no distension.  Musculoskeletal: Normal range of motion.  Neurological: She is alert and oriented to person, place, and time.  Psychiatric: She has a normal mood and affect.    ED Course  Procedures (including critical care time)  DIAGNOSTIC STUDIES: Oxygen Saturation is 99% on RA, Normal by my interpretation.    COORDINATION OF CARE: 11:04 PM-Discussed treatment plan with pt at bedside and pt agreed to plan.     Labs Review Labs Reviewed - No data to display  Imaging Review No results found.   EKG Interpretation None      MDM   Final diagnoses:  Otitis externa, left    amoxicillian Cortisporin otic See your Physicain for recheck in 1 week  I personally performed the services described in this documentation, which was scribed in my presence. The recorded information has been reviewed and is accurate.  Elson AreasLeslie K Genasis Zingale, PA-C 08/30/14 2356  Lonia SkinnerLeslie K ElkhartSofia, PA-C 08/30/14 2356

## 2014-08-31 NOTE — ED Provider Notes (Signed)
Medical screening examination/treatment/procedure(s) were performed by non-physician practitioner and as supervising physician I was immediately available for consultation/collaboration.   EKG Interpretation None        Gwyneth SproutWhitney Tyland Klemens, MD 08/31/14 2351

## 2014-09-11 ENCOUNTER — Encounter (HOSPITAL_COMMUNITY): Payer: Self-pay | Admitting: Emergency Medicine

## 2014-10-31 ENCOUNTER — Emergency Department (HOSPITAL_COMMUNITY)
Admission: EM | Admit: 2014-10-31 | Discharge: 2014-10-31 | Disposition: A | Discharge status: Home / Self Care | Payer: Medicare Other | Attending: Emergency Medicine | Admitting: Emergency Medicine

## 2014-10-31 ENCOUNTER — Encounter (HOSPITAL_COMMUNITY): Payer: Self-pay | Admitting: *Deleted

## 2014-10-31 DIAGNOSIS — Y9389 Activity, other specified: Secondary | ICD-10-CM | POA: Insufficient documentation

## 2014-10-31 DIAGNOSIS — Z79899 Other long term (current) drug therapy: Secondary | ICD-10-CM | POA: Diagnosis not present

## 2014-10-31 DIAGNOSIS — Y92410 Unspecified street and highway as the place of occurrence of the external cause: Secondary | ICD-10-CM | POA: Insufficient documentation

## 2014-10-31 DIAGNOSIS — M545 Low back pain, unspecified: Secondary | ICD-10-CM

## 2014-10-31 DIAGNOSIS — S3992XA Unspecified injury of lower back, initial encounter: Secondary | ICD-10-CM | POA: Insufficient documentation

## 2014-10-31 DIAGNOSIS — Z87891 Personal history of nicotine dependence: Secondary | ICD-10-CM | POA: Insufficient documentation

## 2014-10-31 DIAGNOSIS — Y998 Other external cause status: Secondary | ICD-10-CM | POA: Insufficient documentation

## 2014-10-31 MED ORDER — OXYCODONE-ACETAMINOPHEN 5-325 MG PO TABS
1.0000 | ORAL_TABLET | Freq: Once | ORAL | Status: AC
  Administered 2014-10-31: 1 via ORAL
  Filled 2014-10-31: qty 1

## 2014-10-31 MED ORDER — HYDROCODONE-ACETAMINOPHEN 5-325 MG PO TABS: 1.0000 | ORAL_TABLET | ORAL | Status: DC | PRN

## 2014-10-31 MED ORDER — CYCLOBENZAPRINE HCL 10 MG PO TABS: 10.0000 mg | ORAL_TABLET | Freq: Every day | ORAL | Status: DC
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
## 2014-10-31 NOTE — Discharge Instructions (Signed)
Ice your back 3-4 times a day. Do some gentle stretching to promote healing. Your narcotic pain medication contains Tylenol, do not overdose. Call for a follow up appointment with a Family or Primary Care Provider.  Return if Symptoms worsen.   Take medication as prescribed.

## 2015-11-14 ENCOUNTER — Ambulatory Visit (INDEPENDENT_AMBULATORY_CARE_PROVIDER_SITE_OTHER): Payer: Medicare Other | Admitting: Family Medicine

## 2015-11-14 DIAGNOSIS — Z3481 Encounter for supervision of other normal pregnancy, first trimester: Secondary | ICD-10-CM

## 2015-11-14 DIAGNOSIS — Z3201 Encounter for pregnancy test, result positive: Secondary | ICD-10-CM

## 2015-11-14 LAB — POCT PREGNANCY, URINE: Preg Test, Ur: POSITIVE — AB

## 2015-11-14 NOTE — Progress Notes (Signed)
Pt present today for pregnancy test. Her last period was 09/30/2015. [facility]

## 2015-11-15 LAB — PRENATAL PROFILE (SOLSTAS)
Antibody Screen: NEGATIVE
BASOS ABS: 0 10*3/uL (ref 0.0–0.1)
BASOS PCT: 0 % (ref 0–1)
EOS PCT: 0 % (ref 0–5)
Eosinophils Absolute: 0 10*3/uL (ref 0.0–0.7)
HEMATOCRIT: 33.2 % — AB (ref 36.0–46.0)
HEMOGLOBIN: 10.7 g/dL — AB (ref 12.0–15.0)
HIV 1&2 Ab, 4th Generation: NONREACTIVE
Hepatitis B Surface Ag: NEGATIVE
Lymphocytes Relative: 19 % (ref 12–46)
Lymphs Abs: 2.4 10*3/uL (ref 0.7–4.0)
MCH: 22.4 pg — ABNORMAL LOW (ref 26.0–34.0)
MCHC: 32.2 g/dL (ref 30.0–36.0)
MCV: 69.5 fL — AB (ref 78.0–100.0)
MONO ABS: 0.5 10*3/uL (ref 0.1–1.0)
MPV: 9.9 fL (ref 8.6–12.4)
Monocytes Relative: 4 % (ref 3–12)
NEUTROS ABS: 9.5 10*3/uL — AB (ref 1.7–7.7)
Neutrophils Relative %: 77 % (ref 43–77)
Platelets: 348 10*3/uL (ref 150–400)
RBC: 4.78 MIL/uL (ref 3.87–5.11)
RDW: 16 % — ABNORMAL HIGH (ref 11.5–15.5)
RH TYPE: POSITIVE
Rubella: 3.14 Index — ABNORMAL HIGH (ref <0.90)
WBC: 12.4 10*3/uL — ABNORMAL HIGH (ref 4.0–10.5)

## 2015-11-16 LAB — HEMOGLOBINOPATHY EVALUATION
HGB A: 97.3 % (ref 96.8–97.8)
HGB S QUANTITAION: 0 %
Hemoglobin Other: 0 %
Hgb A2 Quant: 2.7 % (ref 2.2–3.2)
Hgb F Quant: 0 % (ref 0.0–2.0)

## 2015-11-17 DIAGNOSIS — Z3490 Encounter for supervision of normal pregnancy, unspecified, unspecified trimester: Secondary | ICD-10-CM | POA: Insufficient documentation

## 2015-11-19 LAB — CANNABANOIDS (GC/LC/MS), URINE: THC-COOH (GC/LC/MS), ur confirm: 420 ng/mL — AB (ref <5)

## 2015-11-20 LAB — PRESCRIPTION MONITORING PROFILE (19 PANEL)
AMPHETAMINE/METH: NEGATIVE ng/mL
BARBITURATE SCREEN, URINE: NEGATIVE ng/mL
BENZODIAZEPINE SCREEN, URINE: NEGATIVE ng/mL
Buprenorphine, Urine: NEGATIVE ng/mL
CREATININE, URINE: 259.41 mg/dL (ref >20.0)
Carisoprodol, Urine: NEGATIVE ng/mL
Cocaine Metabolites: NEGATIVE ng/mL
Fentanyl, Ur: NEGATIVE ng/mL
MDMA URINE: NEGATIVE ng/mL
Meperidine, Ur: NEGATIVE ng/mL
Methadone Screen, Urine: NEGATIVE ng/mL
Methaqualone: NEGATIVE ng/mL
NITRITES URINE, INITIAL: NEGATIVE ug/mL
OPIATE SCREEN, URINE: NEGATIVE ng/mL
Oxycodone Screen, Ur: NEGATIVE ng/mL
PH URINE, INITIAL: 5.5 pH (ref 4.5–8.9)
PROPOXYPHENE: NEGATIVE ng/mL
Phencyclidine, Ur: NEGATIVE ng/mL
Tapentadol, urine: NEGATIVE ng/mL
Tramadol Scrn, Ur: NEGATIVE ng/mL
ZOLPIDEM, URINE: NEGATIVE ng/mL

## 2015-11-26 ENCOUNTER — Encounter: Payer: Self-pay | Admitting: Obstetrics & Gynecology

## 2015-11-26 DIAGNOSIS — F1910 Other psychoactive substance abuse, uncomplicated: Secondary | ICD-10-CM | POA: Insufficient documentation

## 2015-12-27 ENCOUNTER — Ambulatory Visit (INDEPENDENT_AMBULATORY_CARE_PROVIDER_SITE_OTHER): Payer: Medicare Other | Admitting: Obstetrics & Gynecology

## 2015-12-27 ENCOUNTER — Encounter: Payer: Self-pay | Admitting: Obstetrics & Gynecology

## 2015-12-27 VITALS — BP 146/81 | HR 68 | Temp 98.0°F | Wt 288.7 lb

## 2015-12-27 DIAGNOSIS — O34219 Maternal care for unspecified type scar from previous cesarean delivery: Secondary | ICD-10-CM

## 2015-12-27 DIAGNOSIS — O99321 Drug use complicating pregnancy, first trimester: Secondary | ICD-10-CM | POA: Diagnosis present

## 2015-12-27 DIAGNOSIS — E669 Obesity, unspecified: Secondary | ICD-10-CM

## 2015-12-27 DIAGNOSIS — O161 Unspecified maternal hypertension, first trimester: Secondary | ICD-10-CM | POA: Insufficient documentation

## 2015-12-27 DIAGNOSIS — Z113 Encounter for screening for infections with a predominantly sexual mode of transmission: Secondary | ICD-10-CM | POA: Diagnosis not present

## 2015-12-27 DIAGNOSIS — F1290 Cannabis use, unspecified, uncomplicated: Secondary | ICD-10-CM

## 2015-12-27 DIAGNOSIS — O99211 Obesity complicating pregnancy, first trimester: Secondary | ICD-10-CM

## 2015-12-27 DIAGNOSIS — O99210 Obesity complicating pregnancy, unspecified trimester: Secondary | ICD-10-CM | POA: Insufficient documentation

## 2015-12-27 LAB — POCT URINALYSIS DIP (DEVICE)
BILIRUBIN URINE: NEGATIVE
Glucose, UA: NEGATIVE mg/dL
HGB URINE DIPSTICK: NEGATIVE
KETONES UR: NEGATIVE mg/dL
Leukocytes, UA: NEGATIVE
Nitrite: NEGATIVE
Protein, ur: NEGATIVE mg/dL
Specific Gravity, Urine: 1.02 (ref 1.005–1.030)
Urobilinogen, UA: 0.2 mg/dL (ref 0.0–1.0)
pH: 6.5 (ref 5.0–8.0)

## 2015-12-27 NOTE — Patient Instructions (Signed)

## 2015-12-27 NOTE — Progress Notes (Signed)
  Subjective:    Shawna Hunt is a 30 y.o. G3P0002 at [redacted]w[redacted]d by LMP being seen today for her first obstetrical visit.  Her obstetrical history is significant for previous cesarean section x 2, obesity.. Patient does intend to breast feed. Pregnancy history fully reviewed.  Patient reports no complaints.  Filed Vitals:   12/27/15 0909  BP: 146/81  Pulse: 68  Temp: 98 F (36.7 C)  Weight: 288 lb 11.2 oz (130.953 kg)    HISTORY: OB History  Gravida Para Term Preterm AB SAB TAB Ectopic Multiple Living     # Outcome Date GA Lbr Len/2nd Weight Sex Delivery Anes PTL Lv  3 Current           2 Gravida    6 lb 1 oz (2.75 kg) M  EPI N Y  1 Gravida    8 lb 10 oz (3.912 kg) F CS-LTranv EPI N Y     Complications: Failure to Progress in Second Stage     No past medical history on file. Past Surgical History  Procedure Laterality Date  . Cesarean section      two  . Hernia repair     No family history on file.   Exam    Uterus:     Pelvic Exam: Deferred.   System: Breast:  Deferred   Skin: normal coloration and turgor, no rashes   Neurologic: oriented, normal   Extremities: normal strength, tone, and muscle mass   HEENT PERRLA   Mouth/Teeth mucous membranes moist, pharynx normal without lesions and dental hygiene good   Neck supple and no masses   Cardiovascular: regular rate and rhythm   Respiratory:  appears well, vitals normal, no respiratory distress, acyanotic, normal RR, chest clear, no wheezing, crepitations, rhonchi, normal symmetric air entry   Abdomen: soft, non-tender; bowel sounds normal; no masses,  no organomegaly      Assessment:    Pregnancy: [id number] Patient Active Problem List   Diagnosis Date Noted  . Previous cesarean deliveryx 2, antepartum 12/27/2015  . Elevated blood pressure affecting pregnancy in first trimester, antepartum 12/27/2015  . Obesity in pregnancy, antepartum 12/27/2015  . Marijuana smoker (HCC) 11/26/2015  .  Supervision of normal pregnancy, antepartum 11/17/2015     Plan:   Initial labs reviewed, will get 1 hr GTT next visit Continue Prenatal vitamins. Monitor BP, has no history of HTN issues.  Problem list reviewed and updated. Genetic Screening discussed First Screen: ordered. Ultrasound discussed; fetal survey: to be ordered. The nature of [facility] with multiple MDs and other Advanced Practice Providers was explained to patient; also emphasized that residents, students are part of our team. Follow up in 4 weeks.    Routine obstetric precautions reviewed.    Tereso Newcomer, MD 12/27/2015

## 2015-12-27 NOTE — Progress Notes (Signed)
First trimester screen scheduled for 01/01/2016 :15 PM

## 2016-01-01 ENCOUNTER — Encounter (HOSPITAL_COMMUNITY): Payer: Self-pay

## 2016-01-01 ENCOUNTER — Ambulatory Visit (HOSPITAL_COMMUNITY): Admission: RE | Admit: 2016-01-01 | Payer: Medicare Other | Source: Ambulatory Visit

## 2016-01-01 ENCOUNTER — Ambulatory Visit (HOSPITAL_COMMUNITY)
Admission: RE | Admit: 2016-01-01 | Discharge: 2016-01-01 | Disposition: A | Discharge status: Home / Self Care | Payer: Medicare Other | Source: Ambulatory Visit | Attending: Obstetrics & Gynecology | Admitting: Obstetrics & Gynecology

## 2016-01-01 DIAGNOSIS — O34219 Maternal care for unspecified type scar from previous cesarean delivery: Secondary | ICD-10-CM

## 2016-01-01 DIAGNOSIS — O99211 Obesity complicating pregnancy, first trimester: Secondary | ICD-10-CM | POA: Diagnosis present

## 2016-01-01 DIAGNOSIS — Z3A13 13 weeks gestation of pregnancy: Secondary | ICD-10-CM | POA: Insufficient documentation

## 2016-01-03 NOTE — Addendum Note (Signed)
Encounter addended by: Tereso Newcomer, MD on: 01/03/2016  9:40 AM<BR>     Documentation filed: Problem List

## 2016-01-08 ENCOUNTER — Other Ambulatory Visit (HOSPITAL_COMMUNITY): Payer: Self-pay | Admitting: *Deleted

## 2016-01-08 DIAGNOSIS — O99210 Obesity complicating pregnancy, unspecified trimester: Secondary | ICD-10-CM

## 2016-01-23 ENCOUNTER — Encounter: Payer: Medicare Other | Admitting: Advanced Practice Midwife

## 2016-01-23 ENCOUNTER — Telehealth: Payer: Self-pay | Admitting: Advanced Practice Midwife

## 2016-01-23 NOTE — Telephone Encounter (Signed)
Called patient due to no show for ob follow up appointment. No voicemail available, mailing certified letter.

## 2016-01-30 ENCOUNTER — Ambulatory Visit (INDEPENDENT_AMBULATORY_CARE_PROVIDER_SITE_OTHER): Payer: Medicare Other | Admitting: Certified Nurse Midwife

## 2016-01-30 VITALS — BP 118/66 | HR 65 | Temp 98.2°F | Wt 285.5 lb

## 2016-01-30 DIAGNOSIS — O132 Gestational [pregnancy-induced] hypertension without significant proteinuria, second trimester: Secondary | ICD-10-CM | POA: Diagnosis not present

## 2016-01-30 DIAGNOSIS — O34219 Maternal care for unspecified type scar from previous cesarean delivery: Secondary | ICD-10-CM | POA: Diagnosis not present

## 2016-01-30 DIAGNOSIS — O161 Unspecified maternal hypertension, first trimester: Secondary | ICD-10-CM

## 2016-01-30 DIAGNOSIS — Z3482 Encounter for supervision of other normal pregnancy, second trimester: Secondary | ICD-10-CM

## 2016-01-30 LAB — POCT URINALYSIS DIP (DEVICE)
Glucose, UA: NEGATIVE mg/dL
Hgb urine dipstick: NEGATIVE
Ketones, ur: 15 mg/dL — AB
Nitrite: NEGATIVE
Protein, ur: 30 mg/dL — AB
Specific Gravity, Urine: 1.025 (ref 1.005–1.030)
Urobilinogen, UA: 0.2 mg/dL (ref 0.0–1.0)
pH: 5.5 (ref 5.0–8.0)

## 2016-01-30 MED ORDER — PROMETHAZINE HCL 25 MG PO TABS: 25.0000 mg | ORAL_TABLET | Freq: Four times a day (QID) | ORAL | Status: DC | PRN

## 2016-01-30 MED ORDER — DOXYLAMINE-PYRIDOXINE 10-10 MG PO TBEC: 10.0000 mg | DELAYED_RELEASE_TABLET | Freq: Two times a day (BID) | ORAL | Status: DC

## 2016-01-30 NOTE — Progress Notes (Signed)
Pt reports increased nausea and vomiting. She is smoking marijuana approximately once daily to help with nausea.

## 2016-01-30 NOTE — Progress Notes (Signed)
Subjective:  Shawna Hunt is a 30 y.o. G3P0002 at 3441w3d being seen today for ongoing prenatal care.  She is currently monitored for the following issues for this high risk pregnancy and has Supervision of normal pregnancy, antepartum; Marijuana smoker (HCC); Previous cesarean deliveryx 2, antepartum; Elevated blood pressure affecting pregnancy in first trimester, antepartum; and Obesity in pregnancy, antepartum on her problem list.  Patient reports nausea and vomiting.  Contractions: Not present. Vag. Bleeding: None.  Movement: Present. Denies leaking of fluid.   The following portions of the patient's history were reviewed and updated as appropriate: allergies, current medications, past family history, past medical history, past social history, past surgical history and problem list. Problem list updated.  Objective:   Filed Vitals:   01/30/16 1115  BP: 118/66  Pulse: 65  Temp: 98.2 F (36.8 C)  Weight: 285 lb 8 oz (129.502 kg)    Fetal Status: Fetal Heart Rate (bpm): 150   Movement: Present     General:  Alert, oriented and cooperative. Patient is in no acute distress.  Skin: Skin is warm and dry. No rash noted.   Cardiovascular: Normal heart rate noted  Respiratory: Normal respiratory effort, no problems with respiration noted  Abdomen: Soft, gravid, appropriate for gestational age. Pain/Pressure: Present     Pelvic: Vag. Bleeding: None     Cervical exam deferred        Extremities: Normal range of motion.  Edema: None  Mental Status: Normal mood and affect. Normal behavior. Normal judgment and thought content.   Urinalysis: Urine Protein: 1+ Urine Glucose: Negative  Assessment and Plan:  Pregnancy: G3P0002 at 3441w3d  1. Supervision of normal pregnancy, antepartum, second trimester Diclegis and Phenergan prescribed  2. Elevated blood pressure affecting pregnancy in first trimester, antepartum Normal today  3. Previous cesarean deliveryx 2, antepartum   Preterm labor  symptoms and general obstetric precautions including but not limited to vaginal bleeding, contractions, leaking of fluid and fetal movement were reviewed in detail with the patient. Please refer to After Visit Summary for other counseling recommendations.  Return in about 4 weeks (around 02/27/2016).   Rhea PinkLori A Clemmons, CNM

## 2016-01-30 NOTE — Patient Instructions (Signed)

## 2016-02-05 ENCOUNTER — Ambulatory Visit (HOSPITAL_COMMUNITY)
Admission: RE | Admit: 2016-02-05 | Discharge: 2016-02-05 | Disposition: A | Discharge status: Home / Self Care | Payer: Medicare Other | Source: Ambulatory Visit | Attending: Obstetrics & Gynecology | Admitting: Obstetrics & Gynecology

## 2016-02-05 ENCOUNTER — Encounter (HOSPITAL_COMMUNITY): Payer: Self-pay

## 2016-02-05 VITALS — BP 115/62 | HR 76 | Wt 288.1 lb

## 2016-02-05 DIAGNOSIS — O99322 Drug use complicating pregnancy, second trimester: Secondary | ICD-10-CM | POA: Diagnosis not present

## 2016-02-05 DIAGNOSIS — O99210 Obesity complicating pregnancy, unspecified trimester: Secondary | ICD-10-CM

## 2016-02-05 DIAGNOSIS — Z3A18 18 weeks gestation of pregnancy: Secondary | ICD-10-CM | POA: Insufficient documentation

## 2016-02-05 DIAGNOSIS — O161 Unspecified maternal hypertension, first trimester: Secondary | ICD-10-CM

## 2016-02-05 DIAGNOSIS — Z36 Encounter for antenatal screening of mother: Secondary | ICD-10-CM | POA: Diagnosis present

## 2016-02-05 DIAGNOSIS — O99212 Obesity complicating pregnancy, second trimester: Secondary | ICD-10-CM | POA: Insufficient documentation

## 2016-02-05 DIAGNOSIS — O99211 Obesity complicating pregnancy, first trimester: Secondary | ICD-10-CM

## 2016-02-05 DIAGNOSIS — O34219 Maternal care for unspecified type scar from previous cesarean delivery: Secondary | ICD-10-CM

## 2016-02-27 ENCOUNTER — Ambulatory Visit (INDEPENDENT_AMBULATORY_CARE_PROVIDER_SITE_OTHER): Payer: Medicare Other | Admitting: Family

## 2016-02-27 VITALS — BP 130/67 | HR 87 | Wt 286.0 lb

## 2016-02-27 DIAGNOSIS — O99212 Obesity complicating pregnancy, second trimester: Secondary | ICD-10-CM

## 2016-02-27 DIAGNOSIS — E669 Obesity, unspecified: Secondary | ICD-10-CM | POA: Diagnosis not present

## 2016-02-27 DIAGNOSIS — N390 Urinary tract infection, site not specified: Secondary | ICD-10-CM

## 2016-02-27 DIAGNOSIS — R8271 Bacteriuria: Secondary | ICD-10-CM

## 2016-02-27 DIAGNOSIS — O34219 Maternal care for unspecified type scar from previous cesarean delivery: Secondary | ICD-10-CM | POA: Diagnosis not present

## 2016-02-27 DIAGNOSIS — O2342 Unspecified infection of urinary tract in pregnancy, second trimester: Secondary | ICD-10-CM | POA: Diagnosis not present

## 2016-02-27 DIAGNOSIS — Z3482 Encounter for supervision of other normal pregnancy, second trimester: Secondary | ICD-10-CM

## 2016-02-27 LAB — POCT URINALYSIS DIP (DEVICE)
GLUCOSE, UA: NEGATIVE mg/dL
HGB URINE DIPSTICK: NEGATIVE
LEUKOCYTES UA: NEGATIVE
Nitrite: POSITIVE — AB
Protein, ur: 30 mg/dL — AB
Urobilinogen, UA: 0.2 mg/dL (ref 0.0–1.0)
pH: 5.5 (ref 5.0–8.0)

## 2016-02-27 NOTE — Progress Notes (Signed)
Subjective:  Shawna Hunt is a 30 y.o. G3P0002 at 691w3d being seen today for ongoing prenatal care.  She is currently monitored for the following issues for this high-risk pregnancy and has Supervision of normal pregnancy, antepartum; Marijuana smoker (HCC); Previous cesarean deliveryx 2, antepartum; Elevated blood pressure affecting pregnancy in first trimester, antepartum; and Obesity in pregnancy, antepartum on her problem list.  Patient reports no complaints.  Contractions: Not present. Vag. Bleeding: None.  Movement: Present. Denies leaking of fluid.   The following portions of the patient's history were reviewed and updated as appropriate: allergies, current medications, past family history, past medical history, past social history, past surgical history and problem list. Problem list updated.  Objective:   Filed Vitals:   02/27/16 1506  BP: 130/67  Pulse: 87  Weight: 286 lb (129.729 kg)    Fetal Status:   Fundal Height: 22 cm Movement: Present     General:  Alert, oriented and cooperative. Patient is in no acute distress.  Skin: Skin is warm and dry. No rash noted.   Cardiovascular: Normal heart rate noted  Respiratory: Normal respiratory effort, no problems with respiration noted  Abdomen: Soft, gravid, appropriate for gestational age. Pain/Pressure: Present     Pelvic: Vag. Bleeding: None     Cervical exam deferred        Extremities: Normal range of motion.  Edema: None  Mental Status: Normal mood and affect. Normal behavior. Normal judgment and thought content.   Urinalysis:     Urine results not available at discharge; nitrites noticed after patient discharged   Assessment and Plan:  Pregnancy: G3P0002 at 811w3d  1. Supervision of normal pregnancy, antepartum, second trimester - AFP, Quad Screen  2. Obesity in pregnancy, antepartum, second trimester - Early 1 hour > plans to come in on 03/03/16  3. Previous cesarean deliveryx 2, antepartum - Repeat   4.   Bacteria in Urine - Urine culture Preterm labor symptoms and general obstetric precautions including but not limited to vaginal bleeding, contractions, leaking of fluid and fetal movement were reviewed in detail with the patient. Please refer to After Visit Summary for other counseling recommendations.  Return in about 4 weeks (around 03/26/2016).   Eino FarberWalidah Kennith GainN Karim, CNM

## 2016-02-27 NOTE — Progress Notes (Signed)
UA shows positive nitrites.

## 2016-02-29 LAB — CULTURE, OB URINE
Colony Count: NO GROWTH
Organism ID, Bacteria: NO GROWTH

## 2016-03-04 ENCOUNTER — Ambulatory Visit (HOSPITAL_COMMUNITY)
Admission: RE | Admit: 2016-03-04 | Discharge: 2016-03-04 | Disposition: A | Discharge status: Home / Self Care | Payer: Medicare Other | Source: Ambulatory Visit | Attending: Family | Admitting: Family

## 2016-03-04 ENCOUNTER — Encounter (HOSPITAL_COMMUNITY): Payer: Self-pay

## 2016-03-04 DIAGNOSIS — O99212 Obesity complicating pregnancy, second trimester: Secondary | ICD-10-CM | POA: Insufficient documentation

## 2016-03-04 DIAGNOSIS — Z3A22 22 weeks gestation of pregnancy: Secondary | ICD-10-CM | POA: Diagnosis not present

## 2016-03-04 DIAGNOSIS — Z36 Encounter for antenatal screening of mother: Secondary | ICD-10-CM | POA: Diagnosis not present

## 2016-03-04 DIAGNOSIS — Z6841 Body Mass Index (BMI) 40.0 and over, adult: Secondary | ICD-10-CM | POA: Diagnosis not present

## 2016-03-04 DIAGNOSIS — O99210 Obesity complicating pregnancy, unspecified trimester: Secondary | ICD-10-CM

## 2016-03-04 DIAGNOSIS — O99322 Drug use complicating pregnancy, second trimester: Secondary | ICD-10-CM | POA: Diagnosis present

## 2016-03-05 ENCOUNTER — Other Ambulatory Visit: Payer: Medicare Other

## 2016-03-05 ENCOUNTER — Other Ambulatory Visit (HOSPITAL_COMMUNITY): Payer: Self-pay | Admitting: *Deleted

## 2016-03-05 DIAGNOSIS — O99210 Obesity complicating pregnancy, unspecified trimester: Secondary | ICD-10-CM

## 2016-03-07 LAB — AFP, QUAD SCREEN
AFP: 37.1 ng/mL
Age Alone: 1:675 {titer}
Curr Gest Age: 21.4 weeks
Down Syndrome Scr Risk Est: 1:345 {titer}
HCG, Total: 24.36 IU/mL
INH: 155.7 pg/mL
Interpretation-AFP: NEGATIVE
MOM FOR AFP: 0.8
MOM FOR INH: 1.13
MoM for hCG: 1.93
Open Spina bifida: NEGATIVE
Osb Risk: <1:54600 {titer}
TRI 18 SCR RISK EST: NEGATIVE
Trisomy 18 (Edward) Syndrome Interp.: 1:21300 {titer}
UE3 MOM: 0.66
uE3 Value: 1.26 ng/mL

## 2016-03-14 ENCOUNTER — Encounter (HOSPITAL_COMMUNITY): Payer: Self-pay | Admitting: *Deleted

## 2016-03-14 ENCOUNTER — Inpatient Hospital Stay (HOSPITAL_COMMUNITY)
Admission: AD | Admit: 2016-03-14 | Discharge: 2016-03-14 | Disposition: A | Discharge status: Home / Self Care | Payer: Medicare Other | Source: Ambulatory Visit | Attending: Obstetrics & Gynecology | Admitting: Obstetrics & Gynecology

## 2016-03-14 DIAGNOSIS — Z87891 Personal history of nicotine dependence: Secondary | ICD-10-CM | POA: Insufficient documentation

## 2016-03-14 DIAGNOSIS — O26899 Other specified pregnancy related conditions, unspecified trimester: Secondary | ICD-10-CM

## 2016-03-14 DIAGNOSIS — O34219 Maternal care for unspecified type scar from previous cesarean delivery: Secondary | ICD-10-CM | POA: Diagnosis not present

## 2016-03-14 DIAGNOSIS — O9989 Other specified diseases and conditions complicating pregnancy, childbirth and the puerperium: Secondary | ICD-10-CM

## 2016-03-14 DIAGNOSIS — R1030 Lower abdominal pain, unspecified: Secondary | ICD-10-CM | POA: Diagnosis not present

## 2016-03-14 DIAGNOSIS — O26892 Other specified pregnancy related conditions, second trimester: Secondary | ICD-10-CM | POA: Diagnosis not present

## 2016-03-14 DIAGNOSIS — Z3A23 23 weeks gestation of pregnancy: Secondary | ICD-10-CM | POA: Diagnosis not present

## 2016-03-14 DIAGNOSIS — R109 Unspecified abdominal pain: Secondary | ICD-10-CM | POA: Diagnosis not present

## 2016-03-14 HISTORY — DX: Other specified health status: Z78.9

## 2016-03-14 LAB — URINALYSIS, ROUTINE W REFLEX MICROSCOPIC
BILIRUBIN URINE: NEGATIVE
Glucose, UA: NEGATIVE mg/dL
HGB URINE DIPSTICK: NEGATIVE
KETONES UR: NEGATIVE mg/dL
Leukocytes, UA: NEGATIVE
Nitrite: NEGATIVE
PROTEIN: NEGATIVE mg/dL
Specific Gravity, Urine: 1.015 (ref 1.005–1.030)
pH: 8.5 — ABNORMAL HIGH (ref 5.0–8.0)

## 2016-03-14 LAB — CBC WITH DIFFERENTIAL/PLATELET
BASOS ABS: 0 10*3/uL (ref 0.0–0.1)
BASOS PCT: 0 %
EOS ABS: 0.1 10*3/uL (ref 0.0–0.7)
EOS PCT: 1 %
HCT: 29 % — ABNORMAL LOW (ref 36.0–46.0)
HEMOGLOBIN: 9.3 g/dL — AB (ref 12.0–15.0)
Lymphocytes Relative: 29 %
Lymphs Abs: 2.6 10*3/uL (ref 0.7–4.0)
MCH: 22.5 pg — ABNORMAL LOW (ref 26.0–34.0)
MCHC: 32.1 g/dL (ref 30.0–36.0)
MCV: 70.2 fL — ABNORMAL LOW (ref 78.0–100.0)
Monocytes Absolute: 0.4 10*3/uL (ref 0.1–1.0)
Monocytes Relative: 5 %
NEUTROS PCT: 65 %
Neutro Abs: 5.8 10*3/uL (ref 1.7–7.7)
PLATELETS: 293 10*3/uL (ref 150–400)
RBC: 4.13 MIL/uL (ref 3.87–5.11)
RDW: 15.3 % (ref 11.5–15.5)
WBC: 8.9 10*3/uL (ref 4.0–10.5)

## 2016-03-14 NOTE — MAU Note (Signed)
Patient presents at [redacted] weeks gestation with c/o sharp lower abdominal pains since 1300 today. Decreased fetal movement noted. Denies bleeding or discharge.

## 2016-03-14 NOTE — MAU Provider Note (Signed)
History     CSN: [id number]  Arrival date and time: 03/14/16 1939   None     Chief Complaint  Patient presents with  . Abdominal Pain   HPI Shawna Hunt is a 29yo G3P2002 at 23+5 who presents with lower abdominal pain since this afternoon.  She says it started suddenly around 1pm.  It is sharp and moves up from the groin to the lower abdomen bilaterally.  It is worse with movement and not present when she is lying flat.  She denies fevers/chills, nausea/vomiting, diarrhea, vaginal discharge, dysuria/hematuria, vaginal bleeding, or other concerns.  She did notice decreased FM earlier today and was concerned about this as well.  She does not think these pains are contractions.  She also notes that she has not had a BM in 3 days, which sometimes happens for her.  Past Medical History  Diagnosis Date  . Medical history non-contributory     Past Surgical History  Procedure Laterality Date  . Cesarean section      two  . Hernia repair      History reviewed. No pertinent family history.  Social History  Substance Use Topics  . Smoking status: Former Smoker    Quit date: 12/19/2013  . Smokeless tobacco: Never Used  . Alcohol Use: Yes    Allergies: No Known Allergies  Prescriptions prior to admission  Medication Sig Dispense Refill Last Dose  . Doxylamine-Pyridoxine (DICLEGIS) 10-10 MG TBEC Take 10 mg by mouth 2 times daily at 12 noon and 4 pm. Take 2 tablets at bedtime. If symptoms persist, add 1 tab in the AM starting on day 3. (Patient not taking: Reported on 02/27/2016) 60 tablet 4 Not Taking  . prenatal vitamin w/FE, FA (NATACHEW) 29-1 MG CHEW chewable tablet Chew 1 tablet by mouth daily at 12 noon.   Taking  . promethazine (PHENERGAN) 25 MG tablet Take 1 tablet (25 mg total) by mouth every 6 (six) hours as needed for nausea or vomiting. 30 tablet 1 Taking    ROS  Per HPI Physical Exam   Blood pressure 122/59, pulse 67, temperature 98.6 F (37 C), temperature source Oral,  resp. rate 16, height  (1.727 m), weight 286 lb (129.729 kg), last menstrual period 09/30/2015.  Physical Exam  Gen: alert, in NAD HEENT: NCAT, normal conjunctivae, moist oral mucosa Chest: normal WOB, lungs CTAB CV: normal rate and regular rhythm, normal S1 and S2, no m/r/g Abd: tender to palpation in lower quadrants bilaterally GU: SVE fingertip/thick/high Ext: no pedal edema Skin: no rashes or lesions noted Psych: cooperative, appropriate affect  FHTs reassuring   MAU Course  Procedures  MDM Patient here with abdominal pain of unknown etiology.  She is not having contractions by toco or history, and SVE does not indicate labor.  She is tender to palpation in the lower quadrants bilaterally but does not have any other s/s of infection.  She does endorse mild constipation, which could be related.  There also could be a component of round ligament pain or stretching from her C-section scars.  Assessment and Plan  29yo G3P2002 at 23+5 who presents with abdominal pain.  -- Not in labor -- CBC reassuring that she does not have serious intraabdominal infection -- Consider dietary fiber, hydration for constipation as well as symptomatic treatment for possible round ligament pain -- Provided reassurance about fetal heart rate -- Discharge home with instructions to follow-up as previously scheduled or sooner for new or worsening symptoms.  Shawna Dice  Felipa Hunt 03/14/2016, 8:41 PM   CNM attestation:  I have seen and examined this patient; I agree with above documentation in the resident's note.   Shawna Hunt is a 30 y.o. G3P2002 reporting low abd discomfort +FM, denies LOF, VB, contractions, vaginal discharge.  PE: BP 122/59 mmHg  Pulse 67  Temp(Src) 98.6 F (37 C) (Oral)  Resp 16  Ht 5\' 8"  (1.727 m)  Wt 129.729 kg (286 lb)  BMI 43.50 kg/m2  LMP 09/30/2015 (Exact Date) Gen: calm comfortable, NAD Resp: normal effort, no distress Abd: gravid  ROS, labs, PMH reviewed NST  approp for GA; no ctx  Plan: - fetal kick counts reinforced, pre labor precautions - continue routine follow up in OB clinic  Shawna Hunt, CNM 1:12 AM  03/15/2016

## 2016-03-14 NOTE — Discharge Instructions (Signed)

## 2016-03-28 ENCOUNTER — Ambulatory Visit (INDEPENDENT_AMBULATORY_CARE_PROVIDER_SITE_OTHER): Payer: Medicare Other | Admitting: Advanced Practice Midwife

## 2016-03-28 VITALS — BP 121/63 | HR 69 | Wt 278.3 lb

## 2016-03-28 DIAGNOSIS — O2612 Low weight gain in pregnancy, second trimester: Secondary | ICD-10-CM | POA: Diagnosis not present

## 2016-03-28 DIAGNOSIS — Z3482 Encounter for supervision of other normal pregnancy, second trimester: Secondary | ICD-10-CM | POA: Diagnosis present

## 2016-03-28 DIAGNOSIS — O2610 Low weight gain in pregnancy, unspecified trimester: Secondary | ICD-10-CM | POA: Insufficient documentation

## 2016-03-28 DIAGNOSIS — L259 Unspecified contact dermatitis, unspecified cause: Secondary | ICD-10-CM | POA: Diagnosis not present

## 2016-03-28 LAB — POCT URINALYSIS DIP (DEVICE)
Bilirubin Urine: NEGATIVE
Glucose, UA: NEGATIVE mg/dL
Hgb urine dipstick: NEGATIVE
Ketones, ur: NEGATIVE mg/dL
Nitrite: NEGATIVE
Protein, ur: NEGATIVE mg/dL
SPECIFIC GRAVITY, URINE: 1.015 (ref 1.005–1.030)
Urobilinogen, UA: 0.2 mg/dL (ref 0.0–1.0)
pH: 8 (ref 5.0–8.0)

## 2016-03-28 NOTE — Patient Instructions (Signed)

## 2016-03-28 NOTE — Progress Notes (Signed)
Pt reports rash around tattoo area. Started two weeks ago

## 2016-03-28 NOTE — Progress Notes (Signed)
Subjective:  Shawna Hunt is a 30 y.o. G3P2002 at 4521w5d being seen today for ongoing prenatal care.  She is currently monitored for the following issues for this high-risk pregnancy and has Supervision of normal pregnancy, antepartum; Marijuana smoker (HCC); Previous cesarean deliveryx 2, antepartum; Elevated blood pressure affecting pregnancy in first trimester, antepartum; Obesity in pregnancy, antepartum; and Poor weight gain of pregnancy on her problem list.  Patient reports pruritic rash on arm left forearm. .  Contractions: Not present. Vag. Bleeding: None.  Movement: Present. Denies leaking of fluid.   The following portions of the patient's history were reviewed and updated as appropriate: allergies, current medications, past family history, past medical history, past social history, past surgical history and problem list. Problem list updated.  Objective:   Filed Vitals:   03/28/16 1114  BP: 121/63  Pulse: 69  Weight: 278 lb 4.8 oz (126.236 kg)    Fetal Status: Fetal Heart Rate (bpm): 145 Fundal Height: 28 cm Movement: Present     General:  Alert, oriented and cooperative. Patient is in no acute distress.  Skin: Skin is warm and dry. No rash noted.   Cardiovascular: Normal heart rate noted  Respiratory: Normal respiratory effort, no problems with respiration noted  Abdomen: Soft, gravid, appropriate for gestational age. Pain/Pressure: Present     Pelvic: Vag. Bleeding: None     Cervical exam deferred        Extremities: Normal range of motion.  Edema: Trace  Mental Status: Normal mood and affect. Normal behavior. Normal judgment and thought content.   Urinalysis: Urine Protein: Negative Urine Glucose: Negative  Assessment and Plan:  Pregnancy: G3P2002 at 3121w5d  1. Supervision of normal pregnancy, antepartum, second trimester   2. Poor weight gain of pregnancy, second trimester  -Ensure PRN   3. Contact dermatitis  -OTC Hydrocortisone cream   Preterm labor  symptoms and general obstetric precautions including but not limited to vaginal bleeding, contractions, leaking of fluid and fetal movement were reviewed in detail with the patient. Please refer to After Visit Summary for other counseling recommendations.  Return in about 2 weeks (around 04/11/2016) for ROB/GTT.   Shawna Hunt, CNM

## 2016-04-15 ENCOUNTER — Other Ambulatory Visit (HOSPITAL_COMMUNITY): Payer: Self-pay | Admitting: Maternal and Fetal Medicine

## 2016-04-15 ENCOUNTER — Ambulatory Visit (INDEPENDENT_AMBULATORY_CARE_PROVIDER_SITE_OTHER): Payer: Medicare Other | Admitting: Obstetrics and Gynecology

## 2016-04-15 ENCOUNTER — Ambulatory Visit (HOSPITAL_COMMUNITY)
Admission: RE | Admit: 2016-04-15 | Discharge: 2016-04-15 | Disposition: A | Discharge status: Home / Self Care | Payer: Medicare Other | Source: Ambulatory Visit | Attending: Family | Admitting: Family

## 2016-04-15 VITALS — BP 122/68 | HR 79 | Wt 280.8 lb

## 2016-04-15 DIAGNOSIS — Z3A28 28 weeks gestation of pregnancy: Secondary | ICD-10-CM | POA: Insufficient documentation

## 2016-04-15 DIAGNOSIS — O99019 Anemia complicating pregnancy, unspecified trimester: Secondary | ICD-10-CM

## 2016-04-15 DIAGNOSIS — O99213 Obesity complicating pregnancy, third trimester: Secondary | ICD-10-CM

## 2016-04-15 DIAGNOSIS — O34219 Maternal care for unspecified type scar from previous cesarean delivery: Secondary | ICD-10-CM | POA: Diagnosis not present

## 2016-04-15 DIAGNOSIS — O99323 Drug use complicating pregnancy, third trimester: Secondary | ICD-10-CM | POA: Diagnosis not present

## 2016-04-15 DIAGNOSIS — O99210 Obesity complicating pregnancy, unspecified trimester: Secondary | ICD-10-CM

## 2016-04-15 DIAGNOSIS — Z6841 Body Mass Index (BMI) 40.0 and over, adult: Secondary | ICD-10-CM | POA: Diagnosis not present

## 2016-04-15 DIAGNOSIS — Z36 Encounter for antenatal screening of mother: Secondary | ICD-10-CM | POA: Diagnosis present

## 2016-04-15 DIAGNOSIS — D509 Iron deficiency anemia, unspecified: Secondary | ICD-10-CM | POA: Insufficient documentation

## 2016-04-15 DIAGNOSIS — O99013 Anemia complicating pregnancy, third trimester: Secondary | ICD-10-CM

## 2016-04-15 LAB — POCT URINALYSIS DIP (DEVICE)
GLUCOSE, UA: NEGATIVE mg/dL
Hgb urine dipstick: NEGATIVE
KETONES UR: NEGATIVE mg/dL
Nitrite: NEGATIVE
Protein, ur: 30 mg/dL — AB
Specific Gravity, Urine: 1.025 (ref 1.005–1.030)
Urobilinogen, UA: 0.2 mg/dL (ref 0.0–1.0)
pH: 5.5 (ref 5.0–8.0)

## 2016-04-15 NOTE — Progress Notes (Signed)
Patient to return for 28 wk labs

## 2016-04-15 NOTE — Progress Notes (Signed)
Subjective:  Shawna Hunt is a 30 y.o. G3P2002 at 8640w2d being seen today for ongoing prenatal care.  She is currently monitored for the following issues for this low-risk pregnancy and has Supervision of normal pregnancy, antepartum; Marijuana smoker (HCC); Previous cesarean deliveryx 2, antepartum; Elevated blood pressure affecting pregnancy in first trimester, antepartum; Obesity in pregnancy, antepartum; Poor weight gain of pregnancy; and Iron deficiency anemia of pregnancy on her problem list.  Patient reports no complaints.  Contractions: Not present. Vag. Bleeding: None.  Movement: Present. Denies leaking of fluid.   The following portions of the patient's history were reviewed and updated as appropriate: allergies, current medications, past family history, past medical history, past social history, past surgical history and problem list. Problem list updated.  Objective:   Filed Vitals:   04/15/16 1432  Weight: 280 lb 12.8 oz (127.37 kg)    Fetal Status: Fetal Heart Rate (bpm): 143   Movement: Present     General:  Alert, oriented and cooperative. Patient is in no acute distress.  Skin: Skin is warm and dry. No rash noted.   Cardiovascular: Normal heart rate noted  Respiratory: Normal respiratory effort, no problems with respiration noted  Abdomen: Soft, gravid, appropriate for gestational age. Pain/Pressure: Present     Pelvic: Vag. Bleeding: None     Cervical exam deferred        Extremities: Normal range of motion.  Edema: None  Mental Status: Normal mood and affect. Normal behavior. Normal judgment and thought content.   Urinalysis: Urine Protein: 1+ Urine Glucose: Negative  Assessment and Plan:  Pregnancy: G3P2002 at 3540w2d  1. Iron deficiency anemia of pregnancy, third trimester Not taking PNV every day or iron supplement> advised to start PNV 1/d and iron 1/d  2. Previous cesarean deliveryx 2, antepartum Plans repeat but not BTL (plans IUD)  Preterm labor symptoms  and general obstetric precautions including but not limited to vaginal bleeding, contractions, leaking of fluid and fetal movement were reviewed in detail with the patient. Please refer to After Visit Summary for other counseling recommendations.  For US today, so will return for 28 wk labs later this week. Return in about 2 weeks (around 04/29/2016).   Danae Orleanseirdre C Tamecia Mcdougald, CNM

## 2016-04-15 NOTE — Patient Instructions (Signed)

## 2016-04-18 ENCOUNTER — Other Ambulatory Visit: Payer: Medicare Other

## 2016-04-18 DIAGNOSIS — Z3492 Encounter for supervision of normal pregnancy, unspecified, second trimester: Secondary | ICD-10-CM

## 2016-04-18 LAB — CBC
HCT: 29.1 % — ABNORMAL LOW (ref 35.0–45.0)
HEMOGLOBIN: 9 g/dL — AB (ref 11.7–15.5)
MCH: 21.7 pg — AB (ref 27.0–33.0)
MCHC: 30.9 g/dL — AB (ref 32.0–36.0)
MCV: 70.1 fL — ABNORMAL LOW (ref 80.0–100.0)
MPV: 10.7 fL (ref 7.5–12.5)
Platelets: 331 10*3/uL (ref 140–400)
RBC: 4.15 MIL/uL (ref 3.80–5.10)
RDW: 16.9 % — ABNORMAL HIGH (ref 11.0–15.0)
WBC: 8.5 10*3/uL (ref 3.8–10.8)

## 2016-04-19 LAB — RPR

## 2016-04-19 LAB — HIV ANTIBODY (ROUTINE TESTING W REFLEX): HIV 1&2 Ab, 4th Generation: NONREACTIVE

## 2016-04-19 LAB — GLUCOSE TOLERANCE, 1 HOUR (50G) W/O FASTING: Glucose, 1 Hr, gestational: 168 mg/dL — ABNORMAL HIGH (ref <140)

## 2016-04-22 ENCOUNTER — Telehealth: Payer: Self-pay | Admitting: *Deleted

## 2016-04-22 NOTE — Telephone Encounter (Signed)
Called pt and left message stating that I am calling with test results and a future appt needed. Please call back and leave a message stating whether a detailed message can be left on her voice mail. Pt needs 3hr GTT

## 2016-04-22 NOTE — Telephone Encounter (Signed)
Pt left message stating we can leave a message on patient voicemail. I have left a detailed message stating she will need a 3 hour gtt. She should called back to scheduled appointment first thing in the morning.

## 2016-04-22 NOTE — Telephone Encounter (Signed)
-----   Message from Levie HeritageJacob J Stinson, DO sent at 04/19/2016  9:00 AM EDT ----- Failed 1 hr gtt - needs 3 hr.

## 2016-04-28 ENCOUNTER — Other Ambulatory Visit: Payer: Medicare Other

## 2016-04-28 DIAGNOSIS — Z3483 Encounter for supervision of other normal pregnancy, third trimester: Secondary | ICD-10-CM

## 2016-04-28 DIAGNOSIS — R7309 Other abnormal glucose: Secondary | ICD-10-CM

## 2016-04-28 LAB — GLUCOSE TOLERANCE, 3 HOURS
GLUCOSE 3 HOUR GTT: 72 mg/dL (ref <145)
GLUCOSE, 2 HOUR-GESTATIONAL: 123 mg/dL (ref <165)
GLUCOSE, FASTING-GESTATIONAL: 80 mg/dL (ref 65–104)
Glucose Tolerance, 1 hour: 129 mg/dL (ref <190)

## 2016-05-07 ENCOUNTER — Ambulatory Visit (INDEPENDENT_AMBULATORY_CARE_PROVIDER_SITE_OTHER): Payer: Medicare Other | Admitting: Obstetrics and Gynecology

## 2016-05-07 VITALS — BP 114/71 | HR 79 | Wt 279.8 lb

## 2016-05-07 DIAGNOSIS — Z23 Encounter for immunization: Secondary | ICD-10-CM | POA: Diagnosis not present

## 2016-05-07 DIAGNOSIS — O99013 Anemia complicating pregnancy, third trimester: Secondary | ICD-10-CM

## 2016-05-07 DIAGNOSIS — O99213 Obesity complicating pregnancy, third trimester: Secondary | ICD-10-CM | POA: Diagnosis not present

## 2016-05-07 DIAGNOSIS — D509 Iron deficiency anemia, unspecified: Secondary | ICD-10-CM | POA: Diagnosis not present

## 2016-05-07 DIAGNOSIS — O34219 Maternal care for unspecified type scar from previous cesarean delivery: Secondary | ICD-10-CM

## 2016-05-07 DIAGNOSIS — E669 Obesity, unspecified: Secondary | ICD-10-CM

## 2016-05-07 DIAGNOSIS — Z3483 Encounter for supervision of other normal pregnancy, third trimester: Secondary | ICD-10-CM

## 2016-05-07 LAB — POCT URINALYSIS DIP (DEVICE)
Bilirubin Urine: NEGATIVE
GLUCOSE, UA: NEGATIVE mg/dL
Hgb urine dipstick: NEGATIVE
KETONES UR: 15 mg/dL — AB
Nitrite: NEGATIVE
PH: 5.5 (ref 5.0–8.0)
PROTEIN: 30 mg/dL — AB
Specific Gravity, Urine: >=1.03 (ref 1.005–1.030)
UROBILINOGEN UA: 0.2 mg/dL (ref 0.0–1.0)

## 2016-05-07 MED ORDER — TETANUS-DIPHTH-ACELL PERTUSSIS 5-2.5-18.5 LF-MCG/0.5 IM SUSP
0.5000 mL | Freq: Once | INTRAMUSCULAR | Status: AC
  Administered 2016-05-07: 0.5 mL via INTRAMUSCULAR

## 2016-05-07 MED ORDER — FUSION PLUS PO CAPS: 1.0000 | ORAL_CAPSULE | Freq: Every day | ORAL | Status: DC

## 2016-05-07 NOTE — Progress Notes (Signed)
Subjective:  Shawna Hunt is a 30 y.o. G3P2002 at 7372w3d being seen today for ongoing prenatal care.  She is currently monitored for the following issues for this high-risk pregnancy and has Supervision of normal pregnancy, antepartum; Marijuana smoker (HCC); Previous cesarean deliveryx 2, antepartum; Elevated blood pressure affecting pregnancy in first trimester, antepartum; Obesity in pregnancy, antepartum; Poor weight gain of pregnancy; and Iron deficiency anemia of pregnancy on her problem list.  Patient reports no complaints.   Contractions: Not present. Vag. Bleeding: None.  Movement: Present. Denies leaking of fluid.   The following portions of the patient's history were reviewed and updated as appropriate: allergies, current medications, past family history, past medical history, past social history, past surgical history and problem list. Problem list updated.  Objective:   Filed Vitals:   05/07/16 1523  BP: 114/71  Pulse: 79  Weight: 279 lb 12.8 oz (126.916 kg)    Fetal Status: Fetal Heart Rate (bpm): 140 Fundal Height: 33 cm Movement: Present  Presentation: Vertex  General:  Alert, oriented and cooperative. Patient is in no acute distress.  Skin: Skin is warm and dry. No rash noted.   Cardiovascular: Normal heart rate noted  Respiratory: Normal respiratory effort, no problems with respiration noted  Abdomen: Soft, gravid, appropriate for gestational age. Pain/Pressure: Present     Pelvic:  Cervical exam deferred        Extremities: Normal range of motion.  Edema: Trace  Mental Status: Normal mood and affect. Normal behavior. Normal judgment and thought content.   Urinalysis:      Assessment and Plan:  Pregnancy: G3P2002 at 3272w3d  1. Previous cesarean deliveryx 2, antepartum Patient requests 8/23. Request sent in today. IUD, no BTL  2. Obesity in pregnancy, antepartum, third trimester BMI 40s. Normal 3hr   3. Iron deficiency anemia of pregnancy, third  trimester Taking PNV sporadically stressed need to take since having rpt c-section. Fusion plus sent in  Preterm labor symptoms and general obstetric precautions including but not limited to vaginal bleeding, contractions, leaking of fluid and fetal movement were reviewed in detail with the patient. Please refer to After Visit Summary for other counseling recommendations.  Return in about 2 weeks (around 05/21/2016).   [place] Shawna Brenae Lasecki, MD

## 2016-05-08 ENCOUNTER — Encounter (HOSPITAL_COMMUNITY): Payer: Self-pay | Admitting: *Deleted

## 2016-05-26 ENCOUNTER — Encounter: Payer: Medicare Other | Admitting: Family Medicine

## 2016-05-29 ENCOUNTER — Ambulatory Visit (INDEPENDENT_AMBULATORY_CARE_PROVIDER_SITE_OTHER): Payer: Medicare Other | Admitting: Obstetrics and Gynecology

## 2016-05-29 VITALS — BP 129/46 | HR 70 | Wt 280.0 lb

## 2016-05-29 DIAGNOSIS — O99013 Anemia complicating pregnancy, third trimester: Secondary | ICD-10-CM

## 2016-05-29 DIAGNOSIS — Z3483 Encounter for supervision of other normal pregnancy, third trimester: Secondary | ICD-10-CM

## 2016-05-29 DIAGNOSIS — O34219 Maternal care for unspecified type scar from previous cesarean delivery: Secondary | ICD-10-CM | POA: Diagnosis not present

## 2016-05-29 DIAGNOSIS — D509 Iron deficiency anemia, unspecified: Secondary | ICD-10-CM | POA: Diagnosis not present

## 2016-05-29 LAB — POCT URINALYSIS DIP (DEVICE)
Bilirubin Urine: NEGATIVE
GLUCOSE, UA: NEGATIVE mg/dL
HGB URINE DIPSTICK: NEGATIVE
Ketones, ur: NEGATIVE mg/dL
NITRITE: NEGATIVE
Protein, ur: NEGATIVE mg/dL
Specific Gravity, Urine: 1.02 (ref 1.005–1.030)
UROBILINOGEN UA: 0.2 mg/dL (ref 0.0–1.0)
pH: 8.5 — ABNORMAL HIGH (ref 5.0–8.0)

## 2016-05-29 NOTE — Progress Notes (Signed)
Prenatal Visit Note Date: 05/29/2016 Clinic: [facility]  Subjective:  Shawna Hunt is a 30 y.o. G3P2002 at 3167w4d being seen today for ongoing prenatal care.  She is currently monitored for the following issues for this low-risk pregnancy and has Supervision of normal pregnancy, antepartum; Marijuana smoker (HCC); Previous cesarean deliveryx 2, antepartum; Obesity in pregnancy, antepartum; Poor weight gain of pregnancy; and Iron deficiency anemia of pregnancy on her problem list.  Patient reports no complaints.   Contractions: Not present. Vag. Bleeding: None.  Movement: Present. Denies leaking of fluid.   The following portions of the patient's history were reviewed and updated as appropriate: allergies, current medications, past family history, past medical history, past social history, past surgical history and problem list. Problem list updated.  Objective:   Filed Vitals:   05/29/16 1248  BP: 129/46  Pulse: 70  Weight: 280 lb (127.007 kg)    Fetal Status: Fetal Heart Rate (bpm): 140   Movement: Present     General:  Alert, oriented and cooperative. Patient is in no acute distress.  Skin: Skin is warm and dry. No rash noted.   Cardiovascular: Normal heart rate noted  Respiratory: Normal respiratory effort, no problems with respiration noted  Abdomen: Soft, gravid, appropriate for gestational age. Pain/Pressure: Present     Pelvic:  Cervical exam deferred        Extremities: Normal range of motion.  Edema: Trace  Mental Status: Normal mood and affect. Normal behavior. Normal judgment and thought content.   Urinalysis: Urine Protein: Negative Urine Glucose: Negative  Assessment and Plan:  Pregnancy: G3P2002 at 3167w4d  1. Supervision of normal pregnancy, antepartum, third trimester Routine care. IUD, no BTL. GBS nv.   2. Iron deficiency anemia of pregnancy, third trimester Taking iron and PNV  3. Previous cesarean deliveryx 2, antepartum Scheduled  already  Preterm labor symptoms and general obstetric precautions including but not limited to vaginal bleeding, contractions, leaking of fluid and fetal movement were reviewed in detail with the patient. Please refer to After Visit Summary for other counseling recommendations.  Return in about 2 weeks (around 06/12/2016).   Shawna Bingharlie Zakariye Nee, MD

## 2016-05-29 NOTE — Progress Notes (Signed)
[facility]

## 2016-06-16 ENCOUNTER — Other Ambulatory Visit (HOSPITAL_COMMUNITY)
Admission: RE | Admit: 2016-06-16 | Discharge: 2016-06-16 | Disposition: A | Discharge status: Home / Self Care | Payer: Medicare Other | Source: Ambulatory Visit | Attending: Obstetrics & Gynecology | Admitting: Obstetrics & Gynecology

## 2016-06-16 ENCOUNTER — Ambulatory Visit (INDEPENDENT_AMBULATORY_CARE_PROVIDER_SITE_OTHER): Payer: Medicare Other | Admitting: Obstetrics & Gynecology

## 2016-06-16 VITALS — BP 117/67 | HR 71 | Wt 284.1 lb

## 2016-06-16 DIAGNOSIS — O99013 Anemia complicating pregnancy, third trimester: Secondary | ICD-10-CM

## 2016-06-16 DIAGNOSIS — E669 Obesity, unspecified: Secondary | ICD-10-CM | POA: Diagnosis not present

## 2016-06-16 DIAGNOSIS — D509 Iron deficiency anemia, unspecified: Secondary | ICD-10-CM

## 2016-06-16 DIAGNOSIS — Z113 Encounter for screening for infections with a predominantly sexual mode of transmission: Secondary | ICD-10-CM | POA: Diagnosis not present

## 2016-06-16 DIAGNOSIS — O99213 Obesity complicating pregnancy, third trimester: Secondary | ICD-10-CM

## 2016-06-16 DIAGNOSIS — Z3483 Encounter for supervision of other normal pregnancy, third trimester: Secondary | ICD-10-CM

## 2016-06-16 DIAGNOSIS — O34219 Maternal care for unspecified type scar from previous cesarean delivery: Secondary | ICD-10-CM | POA: Diagnosis not present

## 2016-06-16 DIAGNOSIS — Z3493 Encounter for supervision of normal pregnancy, unspecified, third trimester: Secondary | ICD-10-CM

## 2016-06-16 LAB — POCT URINALYSIS DIP (DEVICE)
Bilirubin Urine: NEGATIVE
GLUCOSE, UA: NEGATIVE mg/dL
Hgb urine dipstick: NEGATIVE
Ketones, ur: NEGATIVE mg/dL
NITRITE: NEGATIVE
PROTEIN: NEGATIVE mg/dL
SPECIFIC GRAVITY, URINE: 1.02 (ref 1.005–1.030)
UROBILINOGEN UA: 0.2 mg/dL (ref 0.0–1.0)
pH: 6 (ref 5.0–8.0)

## 2016-06-16 NOTE — Progress Notes (Signed)
36 wk cultures  Pt reports numbness in mostly right arm at times in the left, "chest feels like it is going to explode",  O2 sats @ 99% RA

## 2016-06-16 NOTE — Progress Notes (Signed)
Subjective:  Shawna PesterJessica Hunt is a 30 y.o. S [facility] G3P2002 at 6113w1d being seen today for ongoing prenatal care.  She is currently monitored for the following issues for this low-risk pregnancy and has Supervision of normal pregnancy, antepartum; Marijuana smoker (HCC); Previous cesarean deliveryx 2, antepartum; Obesity in pregnancy, antepartum; Poor weight gain of pregnancy; and Iron deficiency anemia of pregnancy on her problem list.  Patient reports no complaints.  Contractions: Not present. Vag. Bleeding: None.  Movement: Present. Denies leaking of fluid.   The following portions of the patient's history were reviewed and updated as appropriate: allergies, current medications, past family history, past medical history, past social history, past surgical history and problem list. Problem list updated.  Objective:   Vitals:   06/16/16 0751  BP: 117/67  Pulse: 71  Weight: 284 lb 1.6 oz (128.9 kg)    Fetal Status: Fetal Heart Rate (bpm): 139   Movement: Present     General:  Alert, oriented and cooperative. Patient is in no acute distress.  Skin: Skin is warm and dry. No rash noted.   Cardiovascular: Normal heart rate noted  Respiratory: Normal respiratory effort, no problems with respiration noted  Abdomen: Soft, gravid, appropriate for gestational age. Pain/Pressure: Present     Pelvic:  Cervical exam performed        Extremities: Normal range of motion.  Edema: Mild pitting, slight indentation  Mental Status: Normal mood and affect. Normal behavior. Normal judgment and thought content.   Urinalysis: Urine Protein: Negative Urine Glucose: Negative  Assessment and Plan:  Pregnancy: G3P2002 at 7013w1d  1. Supervision of normal pregnancy in third trimester  - Culture, beta strep (group b only) - [facility]Chlam[facility]  2. Iron deficiency anemia of pregnancy, third trimester   3. Obesity in pregnancy, antepartum, third trimester   4. Previous cesarean  deliveryx 2, antepartum - Scheduled 06-01-16  5. Supervision of normal pregnancy, antepartum, third trimester   Term labor symptoms and general obstetric precautions including but not limited to vaginal bleeding, contractions, leaking of fluid and fetal movement were reviewed in detail with the patient. Please refer to After Visit Summary for other counseling recommendations.  Return in about 1 week (around 06/23/2016).   Allie BossierMyra C Orris Perin, MD

## 2016-06-17 LAB — GC/CHLAMYDIA PROBE AMP (~~LOC~~) NOT AT ARMC
CHLAMYDIA, DNA PROBE: NEGATIVE
Neisseria Gonorrhea: NEGATIVE

## 2016-06-18 LAB — CULTURE, BETA STREP (GROUP B ONLY)

## 2016-06-23 ENCOUNTER — Encounter: Payer: Self-pay | Admitting: Obstetrics & Gynecology

## 2016-06-23 DIAGNOSIS — O99820 Streptococcus B carrier state complicating pregnancy: Secondary | ICD-10-CM | POA: Insufficient documentation

## 2016-06-25 ENCOUNTER — Encounter (HOSPITAL_COMMUNITY): Payer: Self-pay

## 2016-06-25 ENCOUNTER — Telehealth (HOSPITAL_COMMUNITY): Payer: Self-pay | Admitting: *Deleted

## 2016-06-25 NOTE — Telephone Encounter (Signed)
Preadmission screen  

## 2016-06-26 ENCOUNTER — Ambulatory Visit (INDEPENDENT_AMBULATORY_CARE_PROVIDER_SITE_OTHER): Payer: Medicare Other | Admitting: Obstetrics & Gynecology

## 2016-06-26 VITALS — BP 128/78 | HR 78 | Wt 287.6 lb

## 2016-06-26 DIAGNOSIS — O99820 Streptococcus B carrier state complicating pregnancy: Secondary | ICD-10-CM

## 2016-06-26 DIAGNOSIS — D509 Iron deficiency anemia, unspecified: Secondary | ICD-10-CM

## 2016-06-26 DIAGNOSIS — F1220 Cannabis dependence, uncomplicated: Secondary | ICD-10-CM

## 2016-06-26 DIAGNOSIS — Z3483 Encounter for supervision of other normal pregnancy, third trimester: Secondary | ICD-10-CM

## 2016-06-26 DIAGNOSIS — F1290 Cannabis use, unspecified, uncomplicated: Secondary | ICD-10-CM

## 2016-06-26 DIAGNOSIS — O99213 Obesity complicating pregnancy, third trimester: Secondary | ICD-10-CM

## 2016-06-26 DIAGNOSIS — E669 Obesity, unspecified: Secondary | ICD-10-CM

## 2016-06-26 DIAGNOSIS — Z22330 Carrier of Group B streptococcus: Secondary | ICD-10-CM

## 2016-06-26 DIAGNOSIS — O34219 Maternal care for unspecified type scar from previous cesarean delivery: Secondary | ICD-10-CM

## 2016-06-26 DIAGNOSIS — O2613 Low weight gain in pregnancy, third trimester: Secondary | ICD-10-CM

## 2016-06-26 DIAGNOSIS — O99013 Anemia complicating pregnancy, third trimester: Secondary | ICD-10-CM

## 2016-06-26 LAB — POCT URINALYSIS DIP (DEVICE)
BILIRUBIN URINE: NEGATIVE
Glucose, UA: NEGATIVE mg/dL
Hgb urine dipstick: NEGATIVE
KETONES UR: NEGATIVE mg/dL
NITRITE: NEGATIVE
PROTEIN: 30 mg/dL — AB
Specific Gravity, Urine: >=1.03 (ref 1.005–1.030)
UROBILINOGEN UA: 1 mg/dL (ref 0.0–1.0)
pH: 6 (ref 5.0–8.0)

## 2016-06-26 NOTE — Patient Instructions (Addendum)
Cesarean Delivery Cesarean delivery is the birth of a baby through a cut (incision) in the abdomen and womb (uterus).  LET YOUR HEALTH CARE PROVIDER KNOW ABOUT:  All medicines you are taking, including vitamins, herbs, eye drops, creams, and over-the-counter medicines.  Previous problems you or members of your family have had with the use of anesthetics.  Any bleeding or blood clotting disorders you have.  Family history of blood clots or bleeding disorders.  Any history of deep vein thrombosis (DVT) or pulmonary embolism (PE).  Previous surgeries you have had.  Medical conditions you have.  Any allergies you have.  Complicationsinvolving the pregnancy. RISKS AND COMPLICATIONS  Generally, this is a safe procedure. However, as with any procedure, complications can occur. Possible complications include:  Bleeding.  Infection.  Blood clots.  Injury to surrounding organs.  Problems with anesthesia.  Injury to the baby. BEFORE THE PROCEDURE   You may be given an antacid medicine to drink. This will prevent acid contents in your stomach from going into your lungs if you vomit during the surgery.  You may be given an antibiotic medicine to prevent infection. PROCEDURE   To prevent infection of your incision:  Hair may be removed from your pubic area if it is near your incision.  The skin of your pubic area and lower abdomen will be cleaned with a germ-killing solution (antiseptic).  A tube (Foley catheter) will be placed in your bladder to drain your urine from your bladder into a bag. This keeps your bladder empty during surgery.  An IV tube will be placed in your vein.  You may be given medicine to numb the lower half of your body (regional anesthetic). If you were in labor, you may have already had an epidural in place which can be used in both labor and cesarean delivery. You may possibly be given medicine to make you sleep (general anesthetic) though this is not as  common.  Your heart rate and your baby's heart rate will be monitored.  An incision will be made in your abdomen that extends to your uterus. There are 2 basic kinds of incisions:  The horizontal (transverse) incision. Horizontal incisions are from side to side and are used for most routine cesarean deliveries.  The vertical incision. The vertical incision is from the top of the abdomen to the bottom and is less commonly used. It is often done for women who have a serious complication (extreme prematurity) or under emergency situations.  The horizontal and vertical incisions may both be used at the same time. However, this is very uncommon.  An incision is then made in your uterus to deliver the baby.  Your baby will be delivered.  Your health care provider may place the baby on your chest. It is important to keep the baby warm. Your health care provider will dry off the baby, place the baby directly on your bare skin, and cover the baby with warm, dry blankets.  Both incisions will be closed with absorbable stitches. AFTER THE PROCEDURE   If you were awake during the surgery, you will see your baby right away. If you were asleep, you will see your baby as soon as you are awake.  You may breastfeed your baby after surgery.  You may be able to get up and walk the same day as the surgery. If you need to stay in bed for a period of time, you will receive help to turn, cough, and take deep breaths after   surgery. This helps prevent lung problems such as pneumonia.  Do not get out of bed alone the first time after surgery. You will need help getting out of bed until you are able to do this by yourself.  You may be able to shower the day after your cesarean delivery. After the bandage (dressing) is taken off the incision site, a nurse will assist you to shower if you would like help.  You may be directed to take actions to help prevent blood clots in your legs. These may  include:  Walking shortly after surgery, with someone assisting you. Moving around after surgery helps to improve blood flow.  Wearing compression stockings or using different types of devices.  Taking medicines to thin your blood (anticoagulants) if you are at high risk for DVT or PE.  Save any blood clots that you pass from your vagina. If you pass a clot while on the toilet, do not flush it. Call for the nurse. Tell the nurse if you think you are bleeding too much or passing too many clots.  You will be given medicine for pain and nausea as needed. Let your health care providers know if you are hurting. You may also be given an antibiotic to prevent an infection.  Your IV tube will be taken out when you are drinking a reasonable amount of fluids. The Foley catheter is taken out when you are up and walking.  If your blood type is Rh negative and your baby's blood type is Rh positive, you will be given a shot of anti-D immune globulin. This shot prevents you from having Rh problems with a future pregnancy. You should get the shot even if you had your tubes tied (tubal ligation).  If you are allowed to take the baby for a walk, place the baby in the bassinet and push it.   This information is not intended to replace advice given to you by your health care provider. Make sure you discuss any questions you have with your health care provider.   Document Released: 10/27/2005 Document Revised: 07/18/2015 Document Reviewed: 06/23/2012 Elsevier Interactive Patient Education 2016 [employer]  

## 2016-06-26 NOTE — Progress Notes (Signed)
Subjective:  Shawna Hunt is a 30 y.o. G3P2002 at 6727w4d being seen today for ongoing prenatal care.  She is currently monitored for the following issues for this high-risk pregnancy and has Supervision of normal pregnancy, antepartum; Marijuana smoker (HCC); Previous cesarean deliveryx 2, antepartum; Obesity in pregnancy, antepartum; Poor weight gain of pregnancy; Iron deficiency anemia of pregnancy; and Group B Streptococcus carrier, +RV culture, currently pregnant on her problem list.  Patient reports no complaints.  Contractions: Not present. Vag. Bleeding: None.  Movement: Present. Denies leaking of fluid.   The following portions of the patient's history were reviewed and updated as appropriate: allergies, current medications, past family history, past medical history, past social history, past surgical history and problem list. Problem list updated.  Objective:   Vitals:   06/26/16 1607  BP: 128/78  Pulse: 78  Weight: 287 lb 9.6 oz (130.5 kg)    Fetal Status: Fetal Heart Rate (bpm): 139   Movement: Present     General:  Alert, oriented and cooperative. Patient is in no acute distress.  Skin: Skin is warm and dry. No rash noted.   Cardiovascular: Normal heart rate noted  Respiratory: Normal respiratory effort, no problems with respiration noted  Abdomen: Soft, gravid, appropriate for gestational age. Pain/Pressure: Present     Pelvic:  Cervical exam deferred        Extremities: Normal range of motion.  Edema: Mild pitting, slight indentation  Mental Status: Normal mood and affect. Normal behavior. Normal judgment and thought content.   Urinalysis:      Assessment and Plan:  Pregnancy: G3P2002 at 5927w4d  1. Supervision of normal pregnancy, antepartum, third trimester No problems  2. Previous cesarean deliveryx 2, antepartum Pt considering a BTL at the time of a c/s   3. Poor weight gain of pregnancy, third trimester  4. Obesity in pregnancy, antepartum, third  trimester  5. Marijuana smoker (HCC)  6. Iron deficiency anemia of pregnancy, third trimester  7. Group B Streptococcus carrier, +RV culture, currently pregnant  Term labor symptoms and general obstetric precautions including but not limited to vaginal bleeding, contractions, leaking of fluid and fetal movement were reviewed in detail with the patient. Please refer to After Visit Summary for other counseling recommendations.  Return in about 3 years (around 06/27/2019).   Willodean Rosenthalarolyn Harraway-Smith, MD

## 2016-06-30 ENCOUNTER — Encounter (HOSPITAL_COMMUNITY)
Admission: RE | Admit: 2016-06-30 | Discharge: 2016-06-30 | Disposition: A | Discharge status: Home / Self Care | Payer: Medicare Other | Source: Ambulatory Visit | Attending: Obstetrics & Gynecology | Admitting: Obstetrics & Gynecology

## 2016-06-30 ENCOUNTER — Encounter (HOSPITAL_COMMUNITY): Payer: Self-pay

## 2016-06-30 LAB — CBC
HEMATOCRIT: 30 % — AB (ref 36.0–46.0)
HEMOGLOBIN: 10 g/dL — AB (ref 12.0–15.0)
MCH: 23 pg — ABNORMAL LOW (ref 26.0–34.0)
MCHC: 33.3 g/dL (ref 30.0–36.0)
MCV: 69 fL — AB (ref 78.0–100.0)
Platelets: 246 10*3/uL (ref 150–400)
RBC: 4.35 MIL/uL (ref 3.87–5.11)
RDW: 16.8 % — AB (ref 11.5–15.5)
WBC: 8 10*3/uL (ref 4.0–10.5)

## 2016-06-30 LAB — ABO/RH: ABO/RH(D): B POS

## 2016-06-30 NOTE — Patient Instructions (Signed)
Or 20 Darcus PesterJessica Fargnoli  06/30/2016   Your procedure is scheduled on:  8.23.2017  Enter through the Main Entrance of [facility] at 0800 AM.  Pick up the phone at the desk and dial [phone].   Call this number if you have problems the morning of surgery: [phone]   Remember:   Do not eat food:After Midnight.  Do not drink clear liquids: After Midnight.  Take these medicines the morning of surgery with A SIP OF WATER: none   Do not wear jewelry, make-up or nail polish.  Do not wear lotions, powders, or perfumes. You may wear deodorant.  Do not shave 48 hours prior to surgery.  Do not bring valuables to the hospital.  [facility] is not   responsible for any belongings or valuables brought to the hospital.  Contacts, dentures or bridgework may not be worn into surgery.  Leave suitcase in the car. After surgery it may be brought to your room.  For patients admitted to the hospital, checkout time is 11:00 AM the day of              discharge.   Patients discharged the day of surgery will not be allowed to drive             home.  Name and phone number of your driver: na   Special Instructions:   N/A   Please read over the following fact sheets that you were given:   Surgical Site Infection Prevention

## 2016-07-01 ENCOUNTER — Encounter (HOSPITAL_COMMUNITY)
Admission: RE | Admit: 2016-07-01 | Discharge: 2016-07-01 | Disposition: A | Discharge status: Home / Self Care | Payer: Medicare Other | Source: Ambulatory Visit | Attending: Obstetrics & Gynecology | Admitting: Obstetrics & Gynecology

## 2016-07-01 ENCOUNTER — Encounter (HOSPITAL_COMMUNITY): Payer: Self-pay

## 2016-07-01 HISTORY — DX: Depression, unspecified: F32.A

## 2016-07-01 HISTORY — DX: Major depressive disorder, single episode, unspecified: F32.9

## 2016-07-01 LAB — RPR: RPR Ser Ql: NONREACTIVE

## 2016-07-02 ENCOUNTER — Inpatient Hospital Stay (HOSPITAL_COMMUNITY)
Admission: RE | Admit: 2016-07-02 | Discharge: 2016-07-05 | DRG: 765 | Disposition: A | Discharge status: Home / Self Care | Payer: Medicare Other | Source: Ambulatory Visit | Attending: Obstetrics and Gynecology | Admitting: Obstetrics and Gynecology

## 2016-07-02 ENCOUNTER — Encounter (HOSPITAL_COMMUNITY)
Admission: RE | Disposition: A | Discharge status: Home / Self Care | Payer: Self-pay | Source: Ambulatory Visit | Attending: Obstetrics and Gynecology

## 2016-07-02 ENCOUNTER — Inpatient Hospital Stay (HOSPITAL_COMMUNITY): Payer: Medicare Other | Admitting: Anesthesiology

## 2016-07-02 ENCOUNTER — Encounter (HOSPITAL_COMMUNITY): Payer: Self-pay

## 2016-07-02 DIAGNOSIS — O2613 Low weight gain in pregnancy, third trimester: Secondary | ICD-10-CM | POA: Diagnosis not present

## 2016-07-02 DIAGNOSIS — Z3A39 39 weeks gestation of pregnancy: Secondary | ICD-10-CM

## 2016-07-02 DIAGNOSIS — D509 Iron deficiency anemia, unspecified: Secondary | ICD-10-CM | POA: Diagnosis not present

## 2016-07-02 DIAGNOSIS — Z6841 Body Mass Index (BMI) 40.0 and over, adult: Secondary | ICD-10-CM | POA: Diagnosis not present

## 2016-07-02 DIAGNOSIS — O34211 Maternal care for low transverse scar from previous cesarean delivery: Principal | ICD-10-CM | POA: Diagnosis present

## 2016-07-02 DIAGNOSIS — O9902 Anemia complicating childbirth: Secondary | ICD-10-CM | POA: Diagnosis present

## 2016-07-02 DIAGNOSIS — Z87891 Personal history of nicotine dependence: Secondary | ICD-10-CM

## 2016-07-02 LAB — PREPARE RBC (CROSSMATCH)

## 2016-07-02 SURGERY — Surgical Case
Anesthesia: Spinal

## 2016-07-02 MED ORDER — DIPHENHYDRAMINE HCL 50 MG/ML IJ SOLN: 12.5000 mg | INTRAMUSCULAR | Status: DC | PRN

## 2016-07-02 MED ORDER — SCOPOLAMINE 1 MG/3DAYS TD PT72
MEDICATED_PATCH | TRANSDERMAL | Status: AC
  Filled 2016-07-02: qty 1

## 2016-07-02 MED ORDER — NALBUPHINE HCL 10 MG/ML IJ SOLN: 5.0000 mg | INTRAMUSCULAR | Status: DC | PRN

## 2016-07-02 MED ORDER — LACTATED RINGERS IV SOLN
INTRAVENOUS | Status: DC | PRN
  Administered 2016-07-02: 40 [IU] via INTRAVENOUS

## 2016-07-02 MED ORDER — ONDANSETRON HCL 4 MG/2ML IJ SOLN: 4.0000 mg | Freq: Three times a day (TID) | INTRAMUSCULAR | Status: DC | PRN

## 2016-07-02 MED ORDER — BUPIVACAINE HCL (PF) 0.5 % IJ SOLN
INTRAMUSCULAR | Status: DC | PRN
  Administered 2016-07-02: 30 mL

## 2016-07-02 MED ORDER — NALBUPHINE HCL 10 MG/ML IJ SOLN: 5.0000 mg | Freq: Once | INTRAMUSCULAR | Status: DC | PRN

## 2016-07-02 MED ORDER — SIMETHICONE 80 MG PO CHEW
80.0000 mg | CHEWABLE_TABLET | ORAL | Status: DC
  Administered 2016-07-03 – 2016-07-04 (×3): 80 mg via ORAL
  Filled 2016-07-02 (×3): qty 1

## 2016-07-02 MED ORDER — MORPHINE SULFATE-NACL 0.5-0.9 MG/ML-% IV SOSY
PREFILLED_SYRINGE | INTRAVENOUS | Status: AC
  Filled 2016-07-02: qty 1

## 2016-07-02 MED ORDER — FERROUS SULFATE 325 (65 FE) MG PO TABS
325.0000 mg | ORAL_TABLET | Freq: Every day | ORAL | Status: DC
  Administered 2016-07-03 – 2016-07-05 (×3): 325 mg via ORAL
  Filled 2016-07-02 (×3): qty 1

## 2016-07-02 MED ORDER — DEXAMETHASONE SODIUM PHOSPHATE 4 MG/ML IJ SOLN
INTRAMUSCULAR | Status: DC | PRN
  Administered 2016-07-02: 4 mg via INTRAVENOUS

## 2016-07-02 MED ORDER — MENTHOL 3 MG MT LOZG: 1.0000 | LOZENGE | OROMUCOSAL | Status: DC | PRN

## 2016-07-02 MED ORDER — KETOROLAC TROMETHAMINE 30 MG/ML IJ SOLN
30.0000 mg | Freq: Four times a day (QID) | INTRAMUSCULAR | Status: DC | PRN
  Administered 2016-07-02: 30 mg via INTRAMUSCULAR

## 2016-07-02 MED ORDER — ZOLPIDEM TARTRATE 5 MG PO TABS: 5.0000 mg | ORAL_TABLET | Freq: Every evening | ORAL | Status: DC | PRN

## 2016-07-02 MED ORDER — FENTANYL CITRATE (PF) 100 MCG/2ML IJ SOLN: 25.0000 ug | INTRAMUSCULAR | Status: DC | PRN

## 2016-07-02 MED ORDER — TETANUS-DIPHTH-ACELL PERTUSSIS 5-2.5-18.5 LF-MCG/0.5 IM SUSP: 0.5000 mL | Freq: Once | INTRAMUSCULAR | Status: DC

## 2016-07-02 MED ORDER — ONDANSETRON HCL 4 MG/2ML IJ SOLN
INTRAMUSCULAR | Status: AC
  Filled 2016-07-02: qty 2

## 2016-07-02 MED ORDER — LACTATED RINGERS IV SOLN
INTRAVENOUS | Status: DC
  Administered 2016-07-02: 15:00:00 via INTRAVENOUS

## 2016-07-02 MED ORDER — SIMETHICONE 80 MG PO CHEW
80.0000 mg | CHEWABLE_TABLET | Freq: Three times a day (TID) | ORAL | Status: DC
  Administered 2016-07-02 – 2016-07-05 (×8): 80 mg via ORAL
  Filled 2016-07-02 (×8): qty 1

## 2016-07-02 MED ORDER — WITCH HAZEL-GLYCERIN EX PADS: 1.0000 "application " | MEDICATED_PAD | CUTANEOUS | Status: DC | PRN

## 2016-07-02 MED ORDER — DEXAMETHASONE SODIUM PHOSPHATE 4 MG/ML IJ SOLN
INTRAMUSCULAR | Status: AC
  Filled 2016-07-02: qty 1

## 2016-07-02 MED ORDER — NALOXONE HCL 2 MG/2ML IJ SOSY
1.0000 ug/kg/h | PREFILLED_SYRINGE | INTRAVENOUS | Status: DC | PRN
  Filled 2016-07-02: qty 2

## 2016-07-02 MED ORDER — ACETAMINOPHEN 500 MG PO TABS
1000.0000 mg | ORAL_TABLET | Freq: Four times a day (QID) | ORAL | Status: AC
  Administered 2016-07-02 – 2016-07-03 (×4): 1000 mg via ORAL
  Filled 2016-07-02 (×4): qty 2

## 2016-07-02 MED ORDER — SIMETHICONE 80 MG PO CHEW: 80.0000 mg | CHEWABLE_TABLET | ORAL | Status: DC | PRN

## 2016-07-02 MED ORDER — LACTATED RINGERS IV SOLN: INTRAVENOUS | Status: DC

## 2016-07-02 MED ORDER — OXYCODONE HCL 5 MG PO TABS
10.0000 mg | ORAL_TABLET | ORAL | Status: DC | PRN
  Administered 2016-07-03 – 2016-07-05 (×8): 10 mg via ORAL
  Filled 2016-07-02 (×8): qty 2

## 2016-07-02 MED ORDER — HYDROMORPHONE HCL 1 MG/ML IJ SOLN
INTRAMUSCULAR | Status: DC | PRN
  Administered 2016-07-02: 1 mg via INTRAVENOUS

## 2016-07-02 MED ORDER — NALOXONE HCL 0.4 MG/ML IJ SOLN: 0.4000 mg | INTRAMUSCULAR | Status: DC | PRN

## 2016-07-02 MED ORDER — IBUPROFEN 600 MG PO TABS
600.0000 mg | ORAL_TABLET | Freq: Four times a day (QID) | ORAL | Status: DC
  Administered 2016-07-02 – 2016-07-05 (×11): 600 mg via ORAL
  Filled 2016-07-02 (×12): qty 1

## 2016-07-02 MED ORDER — DIPHENHYDRAMINE HCL 25 MG PO CAPS: 25.0000 mg | ORAL_CAPSULE | Freq: Four times a day (QID) | ORAL | Status: DC | PRN

## 2016-07-02 MED ORDER — PROMETHAZINE HCL 25 MG/ML IJ SOLN: 6.2500 mg | INTRAMUSCULAR | Status: DC | PRN

## 2016-07-02 MED ORDER — SODIUM CHLORIDE 0.9% FLUSH: 3.0000 mL | INTRAVENOUS | Status: DC | PRN

## 2016-07-02 MED ORDER — PHENYLEPHRINE 8 MG IN D5W 100 ML (0.08MG/ML) PREMIX OPTIME
INJECTION | INTRAVENOUS | Status: DC | PRN
  Administered 2016-07-02: 60 ug/min via INTRAVENOUS

## 2016-07-02 MED ORDER — MORPHINE SULFATE (PF) 0.5 MG/ML IJ SOLN
INTRAMUSCULAR | Status: DC | PRN
  Administered 2016-07-02: .2 mg via EPIDURAL

## 2016-07-02 MED ORDER — COCONUT OIL OIL
1.0000 "application " | TOPICAL_OIL | Status: DC | PRN
  Filled 2016-07-02: qty 120

## 2016-07-02 MED ORDER — OXYCODONE HCL 5 MG PO TABS
5.0000 mg | ORAL_TABLET | ORAL | Status: DC | PRN
  Administered 2016-07-03: 5 mg via ORAL
  Filled 2016-07-02: qty 1

## 2016-07-02 MED ORDER — FENTANYL CITRATE (PF) 100 MCG/2ML IJ SOLN
INTRAMUSCULAR | Status: DC | PRN
  Administered 2016-07-02: 20 ug via INTRAVENOUS

## 2016-07-02 MED ORDER — BUPIVACAINE HCL (PF) 0.5 % IJ SOLN
INTRAMUSCULAR | Status: AC
  Filled 2016-07-02: qty 30

## 2016-07-02 MED ORDER — LACTATED RINGERS IV SOLN
Freq: Once | INTRAVENOUS | Status: AC
  Administered 2016-07-02: 1000 mL/h via INTRAVENOUS

## 2016-07-02 MED ORDER — LACTATED RINGERS IV SOLN
INTRAVENOUS | Status: DC
  Administered 2016-07-02 (×3): via INTRAVENOUS

## 2016-07-02 MED ORDER — PHENYLEPHRINE 8 MG IN D5W 100 ML (0.08MG/ML) PREMIX OPTIME
INJECTION | INTRAVENOUS | Status: AC
  Filled 2016-07-02: qty 100

## 2016-07-02 MED ORDER — HYDROMORPHONE HCL 1 MG/ML IJ SOLN
INTRAMUSCULAR | Status: AC
  Filled 2016-07-02: qty 1

## 2016-07-02 MED ORDER — SCOPOLAMINE 1 MG/3DAYS TD PT72
1.0000 | MEDICATED_PATCH | Freq: Once | TRANSDERMAL | Status: DC
  Administered 2016-07-02: 1.5 mg via TRANSDERMAL

## 2016-07-02 MED ORDER — ONDANSETRON HCL 4 MG/2ML IJ SOLN
INTRAMUSCULAR | Status: DC | PRN
  Administered 2016-07-02: 4 mg via INTRAVENOUS

## 2016-07-02 MED ORDER — BUPIVACAINE IN DEXTROSE 0.75-8.25 % IT SOLN
INTRATHECAL | Status: DC | PRN
  Administered 2016-07-02: 1.6 mL via INTRATHECAL

## 2016-07-02 MED ORDER — MEPERIDINE HCL 25 MG/ML IJ SOLN: 6.2500 mg | INTRAMUSCULAR | Status: DC | PRN

## 2016-07-02 MED ORDER — OXYTOCIN 10 UNIT/ML IJ SOLN
INTRAMUSCULAR | Status: AC
  Filled 2016-07-02: qty 4

## 2016-07-02 MED ORDER — FENTANYL CITRATE (PF) 100 MCG/2ML IJ SOLN
INTRAMUSCULAR | Status: AC
  Filled 2016-07-02: qty 2

## 2016-07-02 MED ORDER — SENNOSIDES-DOCUSATE SODIUM 8.6-50 MG PO TABS
2.0000 | ORAL_TABLET | ORAL | Status: DC
  Administered 2016-07-03 – 2016-07-04 (×3): 2 via ORAL
  Filled 2016-07-02 (×3): qty 2

## 2016-07-02 MED ORDER — LACTATED RINGERS IV BOLUS (SEPSIS)
1000.0000 mL | Freq: Once | INTRAVENOUS | Status: AC
  Administered 2016-07-02: 1000 mL via INTRAVENOUS

## 2016-07-02 MED ORDER — PRENATAL MULTIVITAMIN CH
1.0000 | ORAL_TABLET | Freq: Every day | ORAL | Status: DC
  Administered 2016-07-03 – 2016-07-04 (×2): 1 via ORAL
  Filled 2016-07-02 (×3): qty 1

## 2016-07-02 MED ORDER — OXYTOCIN 40 UNITS IN LACTATED RINGERS INFUSION - SIMPLE MED: 2.5000 [IU]/h | INTRAVENOUS | Status: AC

## 2016-07-02 MED ORDER — ACETAMINOPHEN 325 MG PO TABS: 650.0000 mg | ORAL_TABLET | ORAL | Status: DC | PRN

## 2016-07-02 MED ORDER — DEXTROSE 5 % IV SOLN
3.0000 g | INTRAVENOUS | Status: AC
  Administered 2016-07-02: 3 g via INTRAVENOUS
  Filled 2016-07-02: qty 3000

## 2016-07-02 MED ORDER — KETOROLAC TROMETHAMINE 30 MG/ML IJ SOLN: 30.0000 mg | Freq: Four times a day (QID) | INTRAMUSCULAR | Status: DC | PRN

## 2016-07-02 MED ORDER — DIBUCAINE 1 % RE OINT
1.0000 | TOPICAL_OINTMENT | RECTAL | Status: DC | PRN
Start: 2016-07-02 — End: 2016-07-05

## 2016-07-02 MED ORDER — DIPHENHYDRAMINE HCL 25 MG PO CAPS: 25.0000 mg | ORAL_CAPSULE | ORAL | Status: DC | PRN

## 2016-07-02 MED ORDER — KETOROLAC TROMETHAMINE 30 MG/ML IJ SOLN
INTRAMUSCULAR | Status: AC
  Filled 2016-07-02: qty 1

## 2016-07-02 SURGICAL SUPPLY — 40 items
BENZOIN TINCTURE PRP APPL 2/3 (GAUZE/BANDAGES/DRESSINGS) ×3 IMPLANT
BLADE TIP J-PLASMA PRECISE LAP (MISCELLANEOUS) ×3 IMPLANT
CHLORAPREP W/TINT 26ML (MISCELLANEOUS) ×3 IMPLANT
CLAMP CORD UMBIL (MISCELLANEOUS) IMPLANT
CLOSURE STERI STRIP 1/2 X4 (GAUZE/BANDAGES/DRESSINGS) ×3 IMPLANT
CLOTH BEACON ORANGE TIMEOUT ST (SAFETY) ×3 IMPLANT
DRSG OPSITE POSTOP 4X10 (GAUZE/BANDAGES/DRESSINGS) ×3 IMPLANT
ELECT REM PT RETURN 9FT ADLT (ELECTROSURGICAL) ×3
ELECTRODE REM PT RTRN 9FT ADLT (ELECTROSURGICAL) ×1 IMPLANT
EXTENDER TRAXI PANNICULUS (MISCELLANEOUS) ×1 IMPLANT
EXTRACTOR VACUUM KIWI (MISCELLANEOUS) IMPLANT
GLOVE BIO SURGEON STRL SZ7 (GLOVE) ×3 IMPLANT
GLOVE BIOGEL PI IND STRL 7.0 (GLOVE) ×2 IMPLANT
GLOVE BIOGEL PI INDICATOR 7.0 (GLOVE) ×4
GOWN STRL REUS W/TWL LRG LVL3 (GOWN DISPOSABLE) ×6 IMPLANT
GOWN STRL REUS W/TWL XL LVL3 (GOWN DISPOSABLE) ×3 IMPLANT
HEMOSTAT ARISTA ABSORB 3G PWDR (MISCELLANEOUS) ×3 IMPLANT
KIT ABG SYR 3ML LUER SLIP (SYRINGE) IMPLANT
NEEDLE HYPO 22GX1.5 SAFETY (NEEDLE) ×3 IMPLANT
NEEDLE HYPO 25X5/8 SAFETYGLIDE (NEEDLE) IMPLANT
NS IRRIG 1000ML POUR BTL (IV SOLUTION) ×3 IMPLANT
PACK C SECTION WH (CUSTOM PROCEDURE TRAY) ×3 IMPLANT
PAD OB MATERNITY 4.3X12.25 (PERSONAL CARE ITEMS) ×3 IMPLANT
PENCIL SMOKE EVAC W/HOLSTER (ELECTROSURGICAL) ×3 IMPLANT
RETRACTOR TRAXI PANNICULUS (MISCELLANEOUS) ×1 IMPLANT
RTRCTR C-SECT PINK 25CM LRG (MISCELLANEOUS) IMPLANT
SPONGE SURGIFOAM ABS GEL 12-7 (HEMOSTASIS) IMPLANT
STRIP CLOSURE SKIN 1/2X4 (GAUZE/BANDAGES/DRESSINGS) ×2 IMPLANT
SUT PDS AB 0 CTX 60 (SUTURE) IMPLANT
SUT PLAIN 0 NONE (SUTURE) IMPLANT
SUT SILK 0 TIES 10X30 (SUTURE) IMPLANT
SUT VIC AB 0 CT1 36 (SUTURE) ×9 IMPLANT
SUT VIC AB 3-0 CT1 27 (SUTURE) ×2
SUT VIC AB 3-0 CT1 TAPERPNT 27 (SUTURE) ×1 IMPLANT
SUT VIC AB 4-0 KS 27 (SUTURE) IMPLANT
SYR CONTROL 10ML LL (SYRINGE) ×3 IMPLANT
TOWEL OR 17X24 6PK STRL BLUE (TOWEL DISPOSABLE) ×3 IMPLANT
TRAXI PANNICULUS EXTENDER (MISCELLANEOUS) ×2
TRAXI PANNICULUS RETRACTOR (MISCELLANEOUS) ×2
TRAY FOLEY CATH SILVER 14FR (SET/KITS/TRAYS/PACK) ×3 IMPLANT

## 2016-07-02 NOTE — Anesthesia Postprocedure Evaluation (Signed)
Anesthesia Post Note  Patient: Shawna Hunt  Procedure(s) Performed: Procedure(s) (LRB): CESAREAN SECTION (N/A)  Anesthesia Post Evaluation   Last Vitals:  Vitals:   07/02/16 0815  BP: 126/78  Pulse: 75  Resp: 16  Temp: 36.8 C    Last Pain:  Vitals:   07/02/16 0815  TempSrc: Oral   Pain Goal: Patients Stated Pain Goal: 8 (07/02/16 0815)               Eleanora Guinyard

## 2016-07-02 NOTE — Anesthesia Postprocedure Evaluation (Signed)
Anesthesia Post Note  Patient: Shawna PesterJessica Hunt  Procedure(s) Performed: Procedure(s) (LRB): CESAREAN SECTION (N/A)  Patient location during evaluation: Mother Baby Anesthesia Type: Spinal Level of consciousness: awake, awake and alert, oriented and patient cooperative Pain management: pain level controlled Vital Signs Assessment: post-procedure vital signs reviewed and stable Respiratory status: spontaneous breathing, nonlabored ventilation and respiratory function stable Cardiovascular status: stable Postop Assessment: patient able to bend at knees, no headache, no backache and no signs of nausea or vomiting Anesthetic complications: no     Last Vitals:  Vitals:   07/02/16 1120 07/02/16 1228  BP:  101/60  Pulse:  64  Resp:  18  Temp: 36.9 C 37.1 C    Last Pain:  Vitals:   07/02/16 1228  TempSrc: Oral  PainSc:    Pain Goal: Patients Stated Pain Goal: 8 (07/02/16 0815)               Mohab Ashby L

## 2016-07-02 NOTE — Anesthesia Preprocedure Evaluation (Addendum)
Anesthesia Evaluation  Patient identified by MRN, date of birth, ID band Patient awake    Reviewed: Allergy & Precautions, NPO status , Patient's Chart, lab work & pertinent test results  Airway Mallampati: II  TM Distance: >3 FB Neck ROM: Full    Dental no notable dental hx.    Pulmonary neg pulmonary ROS, former smoker,    Pulmonary exam normal breath sounds clear to auscultation       Cardiovascular negative cardio ROS Normal cardiovascular exam Rhythm:Regular Rate:Normal     Neuro/Psych negative neurological ROS  negative psych ROS   GI/Hepatic negative GI ROS, Neg liver ROS,   Endo/Other  Morbid obesity  Renal/GU negative Renal ROS  negative genitourinary   Musculoskeletal negative musculoskeletal ROS (+)   Abdominal (+) + obese,   Peds negative pediatric ROS (+)  Hematology  (+) anemia ,   Anesthesia Other Findings   Reproductive/Obstetrics negative OB ROS                             Anesthesia Physical Anesthesia Plan  ASA: III  Anesthesia Plan: Spinal   Post-op Pain Management:    Induction:   Airway Management Planned: Natural Airway  Additional Equipment:   Intra-op Plan:   Post-operative Plan:   Informed Consent: I have reviewed the patients History and Physical, chart, labs and discussed the procedure including the risks, benefits and alternatives for the proposed anesthesia with the patient or authorized representative who has indicated his/her understanding and acceptance.   Dental advisory given  Plan Discussed with: CRNA  Anesthesia Plan Comments: (Discussed risks and benefits of and differences between spinal and general. Discussed risks of spinal including headache, backache, failure, bleeding and hematoma, infection, and nerve damage. Patient consents to spinal. Questions answered. Platelet count acceptable. Discussed GA backup.)       Anesthesia  Quick Evaluation

## 2016-07-02 NOTE — Addendum Note (Signed)
Addendum  created 07/02/16 1336 by Nihal Marzella L Adilen Pavelko, CRNA   Sign clinical note    

## 2016-07-02 NOTE — Anesthesia Postprocedure Evaluation (Signed)
Anesthesia Post Note  Patient: Shawna Hunt  Procedure(s) Performed: Procedure(s) (LRB): CESAREAN SECTION (N/A)  Patient location during evaluation: PACU Anesthesia Type: Spinal Level of consciousness: oriented and awake and alert Pain management: pain level controlled Vital Signs Assessment: post-procedure vital signs reviewed and stable Respiratory status: spontaneous breathing, respiratory function stable and patient connected to nasal cannula oxygen Cardiovascular status: blood pressure returned to baseline and stable Postop Assessment: no headache, no backache, patient able to bend at knees and spinal receding Anesthetic complications: no     Last Vitals:  Vitals:   07/02/16 1120 07/02/16 1228  BP:  101/60  Pulse:  64  Resp:  18  Temp: 36.9 C 37.1 C    Last Pain:  Vitals:   07/02/16 1228  TempSrc: Oral  PainSc:    Pain Goal: Patients Stated Pain Goal: 8 (07/02/16 0815)               Tayden Nichelson J

## 2016-07-02 NOTE — Progress Notes (Signed)
Patient being checked in order to ambulate. Noted honeycomb dressing completely saturated. BP lying down 97/43. Urine amber 350cc output in 6 hrs. Called Dr. Steffanie RainwaterNick Schenk. Orders to apply pressure dressing and give bolus of 1,000 over 1 hr. Patient comfortable and not light headed.

## 2016-07-02 NOTE — H&P (Signed)
Obstetric Preoperative History and Physical  Shawna Hunt is a 30 y.o. [id number] with IUP at 4015w3d presenting for presenting for scheduled repeat cesarean section.  No acute concerns.   Prenatal Course Pregnancy complications or risks: Patient Active Problem List   Diagnosis Date Noted  . Group B Streptococcus carrier, +RV culture, currently pregnant 06/23/2016  . Iron deficiency anemia of pregnancy 04/15/2016  . Poor weight gain of pregnancy 03/28/2016  . Previous cesarean deliveryx 2, antepartum 12/27/2015  . Obesity in pregnancy, antepartum 12/27/2015  . Marijuana smoker (HCC) 11/26/2015  . Supervision of normal pregnancy, antepartum 11/17/2015   She plans to breastfeed She desires IUD for postpartum contraception.   Prenatal labs and studies: ABO, Rh: --/--/B POS, B POS (08/21 1200) Antibody: NEG (08/21 1200) Rubella: 3.14 (01/04 1508) RPR: Non Reactive (08/21 1200)  HBsAg: NEGATIVE (01/04 1508)  HIV: NONREACTIVE (06/09 0917)  GBS:  1 hr Glucola  WNL Genetic screening normal Anatomy US normal  Prenatal Transfer Tool  Maternal Diabetes: No Genetic Screening: Normal Maternal Ultrasounds/Referrals: Normal Fetal Ultrasounds or other Referrals:  None Maternal Substance Abuse:  No Significant Maternal Medications:  None Significant Maternal Lab Results: None  Past Medical History:  Diagnosis Date  . Depression    was taking a medicine a year ago doesn't remember the name  . Medical history non-contributory     Past Surgical History:  Procedure Laterality Date  . CESAREAN SECTION     two  . HERNIA REPAIR      OB History  Gravida Para Term Preterm AB Living  3 2 2  0 0 2  SAB TAB Ectopic Multiple Live Births  0 0 0 0 2    # Outcome Date GA Lbr Len/2nd Weight Sex Delivery Anes PTL Lv  3 Current           2 Term    6 lb 1 oz (2.75 kg) M CS-Unspec EPI N LIV  1 Term    8 lb 10 oz (3.912 kg) F CS-LTranv EPI N LIV     Complications: Failure to Progress in Second  Stage      Social History   Social History  . Marital status: Single    Spouse name: N/A  . Number of children: N/A  . Years of education: N/A   Social History Main Topics  . Smoking status: Former Smoker    Quit date: 12/19/2013  . Smokeless tobacco: Never Used  . Alcohol use Yes  . Drug use:     Frequency: 3.0 times per week    Types: Marijuana     Comment: last used in april as of 06/25/2016  . Sexual activity: Yes    Birth control/ protection: None, Condom   Other Topics Concern  . None   Social History Narrative  . None    Family History  Problem Relation Age of Onset  . Hypertension Mother   . Hypertension Father   . Hypertension Maternal Aunt   . Diabetes Maternal Aunt   . Hypertension Paternal Aunt     Prescriptions Prior to Admission  Medication Sig Dispense Refill Last Dose  . IRON PO Take 1 tablet by mouth daily.   07/01/2016 at Unknown time  . prenatal vitamin w/FE, FA (NATACHEW) 29-1 MG CHEW chewable tablet Chew 1 tablet by mouth daily at 12 noon.   07/01/2016 at Unknown time    No Known Allergies  Review of Systems: Negative except for what is mentioned in HPI.  Physical Exam:  BP 126/78   Pulse 75   Temp 98.3 F (36.8 C) (Oral)   Resp 16   LMP 09/30/2015 (Exact Date)   SpO2 97%  FHR by Doppler: + bpm GENERAL: Well-developed, well-nourished female in no acute distress.  LUNGS: Clear to auscultation bilaterally.  HEART: Regular rate and rhythm. ABDOMEN: Soft, nontender, nondistended, gravid, well-healed Pfannenstiel incision. PELVIC: Deferred EXTREMITIES: Nontender, no edema, 2+ distal pulses.   Pertinent Labs/Studies:   Results for orders placed or performed during the hospital encounter of 07/02/16 (from the past 72 hour(s))  Prepare RBC (crossmatch)     Status: None   Collection Time: 07/02/16  9:30 AM  Result Value Ref Range   Order Confirmation ORDER PROCESSED BY BLOOD BANK     Assessment and Plan :Shawna Hunt is a 30 y.o.  G3P2002 at 6181w3d being admitted being admitted for scheduled cesarean section. The risks of cesarean section discussed with the patient included but were not limited to: bleeding which may require transfusion or reoperation; infection which may require antibiotics; injury to bowel, bladder, ureters or other surrounding organs; injury to the fetus; need for additional procedures including hysterectomy in the event of a life-threatening hemorrhage; placental abnormalities wth subsequent pregnancies, incisional problems, thromboembolic phenomenon and other postoperative/anesthesia complications. The patient concurred with the proposed plan, giving informed written consent for the procedure. Patient has been NPO since last night she will remain NPO for procedure. Anesthesia and OR aware. Preoperative prophylactic antibiotics and SCDs ordered on call to the OR. To OR when ready.

## 2016-07-02 NOTE — Lactation Note (Signed)
This note was copied from a baby's chart. Lactation Consultation Note  Patient Name: Shawna Hunt [id number]'s Date: 07/02/2016 Reason for consult: Initial assessment   Initial consult with mom of 6 hour old infant. Infant with 2 BF for 25 minutes, 1 attempt, 0 voids, and 0 stools since birth. Infant weight 6 lb 5.6 oz. LATCH Scores 5-7 by bedside RN's.   Mom currently eating dinner infant being held in grandmother's arms. Mom feels BF is going well. She reports she had difficulty latching older child.   BF Resources Handout and LC Brochure given, mom informed of BF Support Groups, LC phone #, and IP/OP Services.   Mom with history of + UDS for [facility] in January. Was not able to discuss effects of THC use and BF as mom with visitors in the room.   Enc mom to BF infant 8-12 x in 24 hours at first feeding cues and to call out for assistance as needed. Mom voiced understanding. Follow up tomorrow and prn.     Maternal Data Formula Feeding for Exclusion: No Does the patient have breastfeeding experience prior to this delivery?: Yes  Feeding Feeding Type: Breast Fed Length of feed: 25 min  LATCH Score/Interventions Latch: Repeated attempts needed to sustain latch, nipple held in mouth throughout feeding, stimulation needed to elicit sucking reflex. Intervention(s): Skin to skin;Teach feeding cues Intervention(s): Adjust position;Assist with latch;Breast massage;Breast compression  Audible Swallowing: None Intervention(s): Skin to skin;Hand expression  Type of Nipple: Everted at rest and after stimulation  Comfort (Breast/Nipple): Soft / non-tender     Hold (Positioning): Assistance needed to correctly position infant at breast and maintain latch. Intervention(s): Breastfeeding basics reviewed;Support Pillows;Position options;Skin to skin  LATCH Score: 6  Lactation Tools Discussed/Used WIC Program: Yes   Consult Status Consult Status: Follow-up Date: 07/03/16 Follow-up  type: In-patient    [name] 07/02/2016, 4:38 PM

## 2016-07-02 NOTE — Anesthesia Procedure Notes (Signed)
Spinal  Patient location during procedure: OR Staffing Anesthesiologist: Sherrian DiversENENNY, Elvis Boot Preanesthetic Checklist Completed: patient identified, site marked, surgical consent, pre-op evaluation, timeout performed, IV checked, risks and benefits discussed and monitors and equipment checked Spinal Block Patient position: sitting Prep: DuraPrep Patient monitoring: blood pressure, continuous pulse ox, cardiac monitor and heart rate Approach: midline Location: L3-4 Injection technique: single-shot Needle Needle type: Pencan  Needle gauge: 24 G Needle length: 10 cm Additional Notes CSF clear. No paresthesia noted. Tolerated well.

## 2016-07-02 NOTE — Op Note (Signed)
Shawna Hunt PROCEDURE DATE: 07/02/2016  PREOPERATIVE DIAGNOSIS: Intrauterine pregnancy at  4512w3d weeks gestation with prior c-section  POSTOPERATIVE DIAGNOSIS: The same  PROCEDURE: Repeat Low Transverse Cesarean Section  SURGEON:  Dr. Eber Jonesarolyn L. Harraway-Smith  Assistant:       Ernestina PennaNicholas Schenk MD  ASSISTANT:  None  Complications: none immediate   INDICATIONS: Shawna PesterJessica Orihuela is a 30 y.o. G3P3003 at 5812w3d here for cesarean section secondary to the indications listed under preoperative diagnosis; please see preoperative note for further details.  The risks of cesarean section were discussed with the patient including but were not limited to: bleeding which may require transfusion or reoperation; infection which may require antibiotics; injury to bowel, bladder, ureters or other surrounding organs; injury to the fetus; need for additional procedures including hysterectomy in the event of a life-threatening hemorrhage; placental abnormalities wth subsequent pregnancies, incisional problems, thromboembolic phenomenon and other postoperative/anesthesia complications.   The patient concurred with the proposed plan, giving informed written consent for the procedure.    FINDINGS:  Viable female infant in cephalic presentation.  Apgars 8 and 9.  Clear amniotic fluid.  Intact placenta, three vessel cord.  Normal uterus, fallopian tubes and ovaries bilaterally.  PROCEDURE IN DETAIL:  The patient preoperatively received intravenous antibiotics and had sequential compression devices applied to her lower extremities.  She was then taken to the operating room where spinal anesthesia was administered and was found to be adequate. She was then placed in a dorsal supine position with a leftward tilt, and prepped and draped in a sterile manner.  A foley catheter was placed into her bladder and attached to constant gravity.  After an adequate timeout was performed, a Pfannenstiel skin incision was made through her  previous scar with scalpel and carried through to the underlying layer of fascia with bovie. The fascia was incised in the midline, and this incision was extended bilaterally using the Mayo scissors.  Kocher clamps were applied to the superior aspect of the fascial incision and the underlying rectus muscles were dissected off bluntly. A similar process was carried out on the inferior aspect of the fascial incision. The rectus muscles were separated in the midline bluntly and the peritoneum was entered bluntly.  Attention was turned to the lower uterine segment where a low transverse hysterotomy incision was made with a scalpel and extended bilaterally with bandage scissors.  The infant was successfully delivered, the cord was clamped and cut after 60 seconds and the infant was handed over to awaiting neonatology team. The placenta was delivered manually. Uterine massage was then administered.  The placenta was intact with a three-vessel cord. The uterus was then cleared of clot and debris.  The hysterotomy was closed with 0 Vicryl in a running locked fashion, and an imbricating layer was also placed with the same suture. The pelvis was cleared of all clot and debris. Hemostasis was confirmed on all surfaces.  The peritoneum and the muscles were reapproximated using 0 Vicryl with 1 interrupted suture. The fascia was then closed using 0 looped PDS.  The subcutaneous layer was irrigated, then reapproximated with 3-0 vicryl in a running fashion, and the skin was closed with a 4-0 Vicryl subcuticular stitch.  30 cc of 0.5% marcaine was injected into the incision and benzoin and steristrips were applied.  The patient tolerated the procedure well. Sponge, lap, instrument and needle counts were correct x 2.  She was taken to the recovery room in stable condition.   Jayesh Marbach L. Harraway-Smith, M.D., Evern CoreFACOG

## 2016-07-02 NOTE — Transfer of Care (Signed)
Immediate Anesthesia Transfer of Care Note  Patient: Shawna PesterJessica Hunt  Procedure(s) Performed: Procedure(s): CESAREAN SECTION (N/A)  Patient Location: PACU  Anesthesia Type:Spinal  Level of Consciousness: awake, alert  and oriented  Airway & Oxygen Therapy: Patient Spontanous Breathing  Post-op Assessment: Report given to RN and Post -op Vital signs reviewed and stable  Post vital signs: Reviewed and stable  Last Vitals:  Vitals:   07/02/16 0815  BP: 126/78  Pulse: 75  Resp: 16  Temp: 36.8 C    Last Pain:  Vitals:   07/02/16 0815  TempSrc: Oral      Patients Stated Pain Goal: 8 (07/02/16 0815)  Complications: No apparent anesthesia complications

## 2016-07-03 LAB — CBC
HEMATOCRIT: 24.3 % — AB (ref 36.0–46.0)
Hemoglobin: 7.8 g/dL — ABNORMAL LOW (ref 12.0–15.0)
MCH: 22.3 pg — ABNORMAL LOW (ref 26.0–34.0)
MCHC: 32.1 g/dL (ref 30.0–36.0)
MCV: 69.6 fL — ABNORMAL LOW (ref 78.0–100.0)
PLATELETS: 232 10*3/uL (ref 150–400)
RBC: 3.49 MIL/uL — ABNORMAL LOW (ref 3.87–5.11)
RDW: 16.9 % — AB (ref 11.5–15.5)
WBC: 8.1 10*3/uL (ref 4.0–10.5)

## 2016-07-03 LAB — BIRTH TISSUE RECOVERY COLLECTION (PLACENTA DONATION)

## 2016-07-03 NOTE — Progress Notes (Signed)
Patient states that she ambulated in the room on her own without any assistance at 2200. She did not get dizzy or lightheaded. I assisted her out of bed to the chair, her gait is unsteady, we decided to leave the catheter in until the morning and would try to get up again.

## 2016-07-03 NOTE — Progress Notes (Signed)
UR chart review completed.  

## 2016-07-03 NOTE — Progress Notes (Signed)
Subjective: Postpartum Day 1: Cesarean Delivery for elective repeat Patient reports tolerating PO, but she has not voided, passed gas, no nausea/vomiting .    Objective: Vital signs in last 24 hours: Temp:  [97.9 F (36.6 C)-98.7 F (37.1 C)] 98 F (36.7 C) (08/24 0430) Pulse Rate:  [57-75] 64 (08/24 0430) Resp:  [16-18] 18 (08/24 0430) BP: (101-126)/(46-78) 113/58 (08/24 0430) SpO2:  [94 %-99 %] 99 % (08/24 0430)  Physical Exam:  General: alert, cooperative and no distress Uterine Fundus: firm Incision: healing well, no significant erythema DVT Evaluation: No evidence of DVT seen on physical exam.   Recent Labs  06/30/16 1200 07/03/16 0544  HGB 10.0* 7.8*  HCT 30.0* 24.3*    Assessment/Plan: Status post Cesarean section. Doing well postoperatively.  Continue current care.  Jasmine DecemberSharon Eshet 07/03/2016, 7:28 AM  OB FELLOW MEDICAL STUDENT NOTE ATTESTATION  I have seen and examined this patient. Note this is a Psychologist, occupationalmedical student note and as such does not necessarily reflect the patient's plan of care. Please see Ernestina Pennaicholas Schenk MD's note for this date of service.    Ernestina Pennaicholas Schenk 07/03/2016, 10:02 AM

## 2016-07-03 NOTE — Progress Notes (Signed)
Patient ambulated out of bed with minimal to no assistance to the bathroom, catheter removed. Emptied for 700 of clear yellow urine. She tolerated well.

## 2016-07-03 NOTE — Progress Notes (Signed)
CSW acknowledged consult and attempted to meet with [name].  When CSW arrived [name] was being visited by family and friends.  CSW will attempt to meet with [name] at a later time.  Yarianna Varble Boyd-Gilyard, MSW, LCSW Clinical Social Work [phone] 

## 2016-07-03 NOTE — Lactation Note (Signed)
This note was copied from a baby's chart. Lactation Consultation Note  Patient Name: Boy Darcus PesterJessica Mcinerney ZOXWR'UToday's Date: 07/03/2016 Reason for consult: Follow-up assessment Baby at 29 hr of life. Upon entry baby was sleeping on pillow at mom's side. Reviewed safe sleep. Mom reports baby is now latching to both breast. She does have bilateral nipple soreness, no skin break down noted. Discussed baby behavior, feeding frequency, voids, wt loss, breast changes, and nipple care. Mom is aware of lactation services and support group. She will call as needed.    Maternal Data    Feeding Feeding Type: Breast Fed (Simultaneous filing. User may not have seen previous data.) Length of feed: 30 min (Simultaneous filing. User may not have seen previous data.)  LATCH Score/Interventions Latch: Repeated attempts needed to sustain latch, nipple held in mouth throughout feeding, stimulation needed to elicit sucking reflex. Intervention(s): Skin to skin;Teach feeding cues;Waking techniques Intervention(s): Adjust position;Assist with latch;Breast compression  Audible Swallowing: A few with stimulation Intervention(s): Skin to skin;Hand expression Intervention(s): Skin to skin;Hand expression  Type of Nipple: Everted at rest and after stimulation  Comfort (Breast/Nipple): Soft / non-tender     Hold (Positioning): Assistance needed to correctly position infant at breast and maintain latch. Intervention(s): Breastfeeding basics reviewed;Support Pillows;Skin to skin;Position options  LATCH Score: 7  Lactation Tools Discussed/Used     Consult Status Consult Status: Follow-up Date: 07/04/16 Follow-up type: In-patient    Rulon Eisenmengerlizabeth E Joy Haegele 07/03/2016, 3:53 PM

## 2016-07-03 NOTE — Progress Notes (Signed)
POSTPARTUM PROGRESS NOTE  Post Operative Day 1 Subjective:  Shawna Hunt is a 30 y.o. [id number] 727w3d s/p RLTCS for elective repeat.  No acute events overnight.  Pt denies problems with ambulating, voiding or po intake.  She denies nausea or vomiting.  Pain is moderately controlled.  She has had flatus. She has not had bowel movement.  Lochia Small.   Objective: Blood pressure (!) 118/51, pulse 61, temperature 98 F (36.7 C), temperature source Oral, resp. rate 16, height 5\' 7"  (1.702 m), weight 287 lb 9.6 oz (130.5 kg), last menstrual period 09/30/2015, SpO2 99 %, unknown if currently breastfeeding.  Physical Exam:  General: alert, cooperative and no distress Lochia:normal flow Chest: no respiratory distress Heart:regular rate, distal pulses intact Incision: C/D/I Abdomen: soft, nontender,  Uterine Fundus: firm, appropriately tender DVT Evaluation: No calf swelling or tenderness Extremities: trace edema   Recent Labs  06/30/16 1200 07/03/16 0544  HGB 10.0* 7.8*  HCT 30.0* 24.3*    Assessment/Plan:  ASSESSMENT: Shawna Hunt is a 30 y.o. [id number] 7327w3d s/p RLTCS for elective repeat  Breastfeeding and Contraception IUD   LOS: 1 day   Les Pouicholas SchenkMD 07/03/2016, 10:34 AM

## 2016-07-04 LAB — TYPE AND SCREEN
ABO/RH(D): B POS
Antibody Screen: NEGATIVE
UNIT DIVISION: 0
Unit division: 0

## 2016-07-04 NOTE — Clinical Social Work Maternal (Signed)
  CLINICAL SOCIAL WORK MATERNAL/CHILD NOTE  Patient Details  Name: Shawna Hunt MRN: [id number] Date of Birth: 1986/08/06  Date:  07/04/2016  Clinical Social Worker Initiating Note:  Laurey Arrow Date/ Time Initiated:  07/04/16/1000     Child's Name:  Shawna Hunt   Legal Guardian:  Mother   Need for Interpreter:  None   Date of Referral:  07/02/16     Reason for Referral:  Current Substance Use/Substance Use During Pregnancy    Referral Source:  [facility]   Address:  [address] [place] [address]  Phone number:  [phone]   Household Members:  Self, Minor Children   Natural Supports (not living in the home):  Immediate Family, Parent   Professional Supports: None   Employment: Unemployed   Type of Work:     Education:  9 to 11 years   Pensions consultant:  [employer]   Other Resources:  [employer], Physicist, medical    Cultural/Religious Considerations Which May Impact Care:  None reported  Strengths:  Ability to meet basic needs , Engineer, materials , Home prepared for child    Risk Factors/Current Problems:  Substance Use    Cognitive State:  Alert , Linear Thinking , Insightful    Mood/Affect:  Interested , Agitated , Calm    CSW Assessment: CSW met with [name] to complete an assessment for a consult for hx of THC use in pregnancy.  [name] was polite, inviting, and receptive to CSW information. CSW inquired about [name]'s SA history.  [name] acknowledged [name] utilized marijuana while pregnant.  [name] reports she utilized marijuana recreational and [name] does not consider her usage to be an addiction. CSW informed [name] of the hospital's drug screen policy, and informed [name] of the two screenings for the infant. [name] became agitated and communicated, "Marijuana is not bad like other substance, and I do not see what the problem is." CSW assisted [name] with processing the hospitals policy and [name]'s agitation decreased. CSW thanked [name] for being honest, and informed [name]  that the infant had a negative UDS. CSW made [name] aware that CSW will continue to monitor the infant's cord and if there is a positive screen without an explanation, CSW will make a report to [place] office. CSW offered [name] resources and referrals for substance abuse; however, [name] declined all information.  CSW educated [name] about PPD.  CSW informed [name] of supports and interventions to decrease PPD and reviewed supports for [name]. CSW also encouraged [name] to seek medical attention if needed for increased signs and symptoms for PPD. CSW reviewed safe sleep and SIDS. [name] was knowledgeable, and asked appropriate questions.   [name] communicated that she has a bassinet and car seat for the baby.  CSW thanked [name] for allowing CSW to meet with [name].   CSW Plan/Description:  Patient/Family Education , No Further Intervention Required/No Barriers to Discharge (CSW will monitor infant's cord and will make a report to CPS if needed. )   Laurey Arrow, MSW, LCSW Clinical Social Work [phone]   Dimple Nanas, LCSW 07/04/2016, 12:08 PM

## 2016-07-04 NOTE — Lactation Note (Signed)
This note was copied from a baby's chart. Lactation Consultation Note  Patient Name: Shawna Darcus PesterJessica Hunt HQION'GToday's Date: 07/04/2016 Reason for consult: Follow-up assessment;Breast/nipple pain;Difficult latch Mom has stopped putting baby to breast due to nipple pain. The left nipple is cracked and more sore than the right nipple, positional stripes on both.  LC discussed using a nipple shield to see if able to tolerate baby at breast, Mom agreed on right breast. Initiated #20 nipple shield, Baby took few attempts to latch, demonstrated good suckling bursts, swallows audible and breast milk in the nipple shield with the 20 minute feeding. Mom tolerated well, reporting some discomfort but improved. Tried to latch baby to left breast using #20 nipple shield. Baby had more difficulty with latch, after few attempts baby was able to latch but Mom reported it was too painful. Discussed pumping and Mom agreed to try post pumping. Baby hungry, Mom giving bottle to complete this feeding. Discussed with Mom, BF on right breast using nipple shield, pumping both breasts then supplementing with each feeding per guidelines per hours of age. Mom said she may try but may also switch to pump/bottle feed. Advised Mom to pump every 3 hours for 15-20 minutes. Mom may need WIC loaner if available. Supplemental guidelines per hours of age reviewed with Mom if BF 1st and with bottle feeding only. Advised to apply EBM to sore nipples, Mom has comfort gels. Encouraged to call for assist as needed.   Maternal Data    Feeding Feeding Type: Breast Fed Length of feed: 5 min (off/on)  LATCH Score/Interventions Latch: Repeated attempts needed to sustain latch, nipple held in mouth throughout feeding, stimulation needed to elicit sucking reflex. (left breast w/20 nipple shield) Intervention(s): Adjust position;Assist with latch  Audible Swallowing: None  Type of Nipple: Flat Intervention(s): Hand pump  Comfort (Breast/Nipple):  Engorged, cracked, bleeding, large blisters, severe discomfort Problem noted: Cracked, bleeding, blisters, bruises Intervention(s): Expressed breast milk to nipple;Double electric pump  Problem noted: Severe discomfort Interventions  (Cracked/bleeding/bruising/blister): Expressed breast milk to nipple Interventions (Mild/moderate discomfort): Comfort gels  Hold (Positioning): Assistance needed to correctly position infant at breast and maintain latch. Intervention(s): Breastfeeding basics reviewed;Support Pillows;Position options;Skin to skin  LATCH Score: 3  Lactation Tools Discussed/Used Tools: Nipple Shields;Pump;Comfort gels Nipple shield size: 20 Breast pump type: Double-Electric Breast Pump   Consult Status Consult Status: Follow-up Date: 07/05/16 Follow-up type: In-patient    Shawna Hunt, Shawna Hunt Ann 07/04/2016, 3:43 PM

## 2016-07-04 NOTE — Lactation Note (Signed)
This note was copied from a baby's chart. Lactation Consultation Note Mom has positional stripes. Hurting, painful latches. Has very compressible nipples and areola. Comfort gels given. Instructed not to use w/coconut oil. Nipples everts w/stimulation, flattens at rest. Hand expression of large pendulum breast no colostrum noted. Noted on baby oral assessment anterior limitation and biting at times. Weak suck, tongue thrusting at times. High oval palate noted.  Mom asked for formula d/t baby acts hungry, settled after Alementum w/syring given via finger. Mom doing STS. Mom wants to rest while baby resting. [facility]. Will set up DEBP, hand pump and give shells to wear to assist in everting nipples.  Patient Name: Shawna Darcus PesterJessica Hunt WUJWJ'XToday's Date: 07/04/2016 Reason for consult: Follow-up assessment;Breast/nipple pain;Infant weight loss;Infant < 6lbs   Maternal Data    Feeding Feeding Type: Formula  LATCH Score/Interventions Intervention(s): Breast massage;Breast compression     Type of Nipple: Flat     Problem noted: Cracked, bleeding, blisters, bruises Interventions (Mild/moderate discomfort): Comfort gels  Intervention(s): Position options;Skin to skin     Lactation Tools Discussed/Used Tools: Comfort gels   Consult Status Consult Status: Follow-up Date: 07/04/16 Follow-up type: In-patient    Charyl DancerCARVER, Jacquita Mulhearn G 07/04/2016, 3:34 AM

## 2016-07-04 NOTE — Progress Notes (Signed)
Subjective: Postpartum Day 2: Cesarean Delivery Patient reports nausea, vomiting, incisional pain, tolerating PO, + flatus and no problems voiding.    Objective: Vital signs in last 24 hours: Temp:  [98 F (36.7 C)-98.3 F (36.8 C)] 98 F (36.7 C) (08/25 0615) Pulse Rate:  [61-71] 71 (08/25 0615) Resp:  [16-18] 18 (08/25 0615) BP: (97-125)/(44-59) 97/44 (08/25 0615)  Physical Exam:  General: alert, cooperative and no distress Lochia: appropriate Uterine Fundus: firm Incision: healing well, no significant drainage, no significant erythema DVT Evaluation: No evidence of DVT seen on physical exam. Negative Homan's sign.   Recent Labs  07/03/16 0544  HGB 7.8*  HCT 24.3*    Assessment/Plan: Status post Cesarean section. Doing well postoperatively.  Mild pain. Continue oxycodone PO Plan for DC home tomorrow.  Leland HerElsia J Yoo PGY-1 07/04/2016, 7:57 AM  OB FELLOW POSTPARTUM PROGRESS NOTE ATTESTATION  I have seen and examined this patient and agree with above documentation in the resident's note.   Ernestina PennaNicholas Othella Slappey, MD 10:13 AM

## 2016-07-05 MED ORDER — OXYCODONE-ACETAMINOPHEN 5-325 MG PO TABS: 1.0000 | ORAL_TABLET | ORAL | 0 refills | Status: DC | PRN

## 2016-07-05 MED ORDER — IBUPROFEN 600 MG PO TABS: 600.0000 mg | ORAL_TABLET | Freq: Four times a day (QID) | ORAL | 1 refills | Status: DC | PRN

## 2016-07-05 NOTE — Discharge Summary (Signed)
OB Discharge Summary     Patient Name: Shawna Hunt DOB: 05/18/1986 MRN: [id number]  Date of admission: 07/02/2016 Delivering MD: Willodean RosenthalHARRAWAY-SMITH, CAROLYN   Date of discharge: 07/05/2016  Admitting diagnosis: PREVIOUS C SECTION GESTATIIONAL HYPERTENSION Intrauterine pregnancy: 833w3d     Secondary diagnosis:  Active Problems:   Cesarean delivery delivered  Additional problems: N/V yesterday resolved. Has switched to bottle feeding.     Discharge diagnosis: Term Pregnancy Delivered                                                                         Iron deficiency anemia                    Post partum procedures:none  Augmentation: n/a  Complications: None  Hospital course:  Sceduled C/S   30 y.o. yo G3P3003 at 6133w3d was admitted to the hospital 07/02/2016 for scheduled cesarean section with the following indication:Elective Repeat.  Membrane Rupture Time/Date: 10:08 AM ,07/02/2016   Patient delivered a Viable infant.07/02/2016  Details of operation can be found in separate operative note.  Pateint had an uncomplicated postpartum course.  She is ambulating, tolerating a regular diet, passing flatus, and urinating well. Patient is discharged home in stable condition on  07/05/16          Physical exam Vitals:   07/03/16 0900 07/03/16 1709 07/04/16 0615 07/05/16 0605  BP: (!) 118/51 (!) 125/59 (!) 97/44 124/65  Pulse: 61 64 71 70  Resp: 16 16 18 18   Temp: 98 F (36.7 C) 98.3 F (36.8 C) 98 F (36.7 C) 98.6 F (37 C)  TempSrc: Oral Oral Oral Oral  SpO2:      Weight:      Height:       General: alert, cooperative and no distress Lochia: appropriate Uterine Fundus: firm Incision: Large panus. Removed pressure dressing. Honeycomb dressing is clean, dry, and intact DVT Evaluation: No evidence of DVT seen on physical exam. Labs: Lab Results  Component Value Date   WBC 8.1 07/03/2016   HGB 7.8 (L) 07/03/2016   HCT 24.3 (L) 07/03/2016   MCV 69.6 (L) 07/03/2016   PLT  232 07/03/2016   CMP Latest Ref Rng & Units 04/28/2016  Glucose 65 - 104 mg/dL 80  BUN 6 - 23 mg/dL -  Creatinine 0.4 - 1.2 mg/dL -  Sodium 409135 - 811145 mEq/L -  Potassium 3.5 - 5.1 mEq/L -  Chloride 96 - 112 mEq/L -  CO2 19 - 32 mEq/L -  Calcium 8.4 - 10.5 mg/dL -  Total Protein 6.0 - 8.3 g/dL -  Total Bilirubin 0.3 - 1.2 mg/dL -  Alkaline Phos 39 - 914117 U/L -  AST 0 - 37 U/L -  ALT 0 - 35 U/L -    Discharge instruction: per After Visit Summary and "Baby and Me Booklet".  After visit meds:    Medication List    TAKE these medications   ibuprofen 600 MG tablet Commonly known as:  ADVIL,MOTRIN Take 1 tablet (600 mg total) by mouth every 6 (six) hours as needed.   IRON PO Take 1 tablet by mouth daily.   oxyCODONE-acetaminophen 5-325 MG tablet Commonly known as:  PERCOCET/ROXICET Take  1 tablet by mouth every 4 (four) hours as needed.   prenatal vitamin w/FE, FA 29-1 MG Chew chewable tablet Chew 1 tablet by mouth daily at 12 noon.       Diet: routine diet  Activity: Advance as tolerated. Pelvic rest for 6 weeks.   Outpatient follow up:6 weeks Follow up Appt:Future Appointments Date Time Provider Department Center  07/31/2016 2:40 PM Tereso Newcomer, MD [facility]   Follow up Visit:No Follow-up on file.  Postpartum contraception: IUD Mirena  Newborn Data: Live born female  Birth Weight: 6 lb 5.6 oz (2880 g) APGAR: 8, 9  Baby Feeding: Bottle Disposition:home with mother   07/05/2016 Tamesha Ellerbrock, CNM

## 2016-07-05 NOTE — Discharge Instructions (Signed)
Cesarean Delivery Cesarean delivery is the birth of a baby through a cut (incision) in the abdomen and womb (uterus).  LET YOUR HEALTH CARE PROVIDER KNOW ABOUT:  All medicines you are taking, including vitamins, herbs, eye drops, creams, and over-the-counter medicines.  Previous problems you or members of your family have had with the use of anesthetics.  Any bleeding or blood clotting disorders you have.  Family history of blood clots or bleeding disorders.  Any history of deep vein thrombosis (DVT) or pulmonary embolism (PE).  Previous surgeries you have had.  Medical conditions you have.  Any allergies you have.  Complicationsinvolving the pregnancy. RISKS AND COMPLICATIONS  Generally, this is a safe procedure. However, as with any procedure, complications can occur. Possible complications include:  Bleeding.  Infection.  Blood clots.  Injury to surrounding organs.  Problems with anesthesia.  Injury to the baby. BEFORE THE PROCEDURE   You may be given an antacid medicine to drink. This will prevent acid contents in your stomach from going into your lungs if you vomit during the surgery.  You may be given an antibiotic medicine to prevent infection. PROCEDURE   To prevent infection of your incision:  Hair may be removed from your pubic area if it is near your incision.  The skin of your pubic area and lower abdomen will be cleaned with a germ-killing solution (antiseptic).  A tube (Foley catheter) will be placed in your bladder to drain your urine from your bladder into a bag. This keeps your bladder empty during surgery.  An IV tube will be placed in your vein.  You may be given medicine to numb the lower half of your body (regional anesthetic). If you were in labor, you may have already had an epidural in place which can be used in both labor and cesarean delivery. You may possibly be given medicine to make you sleep (general anesthetic) though this is not as  common.  Your heart rate and your baby's heart rate will be monitored.  An incision will be made in your abdomen that extends to your uterus. There are 2 basic kinds of incisions:  The horizontal (transverse) incision. Horizontal incisions are from side to side and are used for most routine cesarean deliveries.  The vertical incision. The vertical incision is from the top of the abdomen to the bottom and is less commonly used. It is often done for women who have a serious complication (extreme prematurity) or under emergency situations.  The horizontal and vertical incisions may both be used at the same time. However, this is very uncommon.  An incision is then made in your uterus to deliver the baby.  Your baby will be delivered.  Your health care provider may place the baby on your chest. It is important to keep the baby warm. Your health care provider will dry off the baby, place the baby directly on your bare skin, and cover the baby with warm, dry blankets.  Both incisions will be closed with absorbable stitches. AFTER THE PROCEDURE   If you were awake during the surgery, you will see your baby right away. If you were asleep, you will see your baby as soon as you are awake.  You may breastfeed your baby after surgery.  You may be able to get up and walk the same day as the surgery. If you need to stay in bed for a period of time, you will receive help to turn, cough, and take deep breaths after   surgery. This helps prevent lung problems such as pneumonia.  Do not get out of bed alone the first time after surgery. You will need help getting out of bed until you are able to do this by yourself.  You may be able to shower the day after your cesarean delivery. After the bandage (dressing) is taken off the incision site, a nurse will assist you to shower if you would like help.  You may be directed to take actions to help prevent blood clots in your legs. These may  include:  Walking shortly after surgery, with someone assisting you. Moving around after surgery helps to improve blood flow.  Wearing compression stockings or using different types of devices.  Taking medicines to thin your blood (anticoagulants) if you are at high risk for DVT or PE.  Save any blood clots that you pass from your vagina. If you pass a clot while on the toilet, do not flush it. Call for the nurse. Tell the nurse if you think you are bleeding too much or passing too many clots.  You will be given medicine for pain and nausea as needed. Let your health care providers know if you are hurting. You may also be given an antibiotic to prevent an infection.  Your IV tube will be taken out when you are drinking a reasonable amount of fluids. The Foley catheter is taken out when you are up and walking.  If your blood type is Rh negative and your baby's blood type is Rh positive, you will be given a shot of anti-D immune globulin. This shot prevents you from having Rh problems with a future pregnancy. You should get the shot even if you had your tubes tied (tubal ligation).  If you are allowed to take the baby for a walk, place the baby in the bassinet and push it.   This information is not intended to replace advice given to you by your health care provider. Make sure you discuss any questions you have with your health care provider.   Document Released: 10/27/2005 Document Revised: 07/18/2015 Document Reviewed: 06/23/2012 Elsevier Interactive Patient Education 2016 [employer]  

## 2016-07-05 NOTE — Lactation Note (Signed)
This note was copied from a baby's chart. Lactation Consultation Note  Mother has decided to formula feed baby. She is aware that she can call with any questions or concerns. Patient Name: Shawna Darcus PesterJessica Rammel ZOXWR'UToday's Date: 07/05/2016 Reason for consult: Follow-up assessment   Maternal Data    Feeding Feeding Type: Bottle Fed - Formula Nipple Type: Slow - flow  LATCH Score/Interventions                      Lactation Tools Discussed/Used     Consult Status      Soyla DryerJoseph, [name] 07/05/2016, 10:31 AM

## 2016-07-31 ENCOUNTER — Ambulatory Visit: Payer: Medicare Other | Admitting: Obstetrics and Gynecology

## 2016-07-31 ENCOUNTER — Ambulatory Visit: Payer: Medicare Other | Admitting: Obstetrics & Gynecology

## 2016-08-01 ENCOUNTER — Encounter: Payer: Self-pay | Admitting: Obstetrics and Gynecology

## 2016-08-01 NOTE — Progress Notes (Signed)
Patient did not keep 07/31/2016 postpartum visit  [place] Bingharlie Chaunta Bejarano, Montez HagemanJr MD Attending [facility]

## 2016-09-15 ENCOUNTER — Encounter (HOSPITAL_COMMUNITY): Payer: Self-pay | Admitting: Vascular Surgery

## 2016-09-15 ENCOUNTER — Emergency Department (HOSPITAL_COMMUNITY)
Admission: EM | Admit: 2016-09-15 | Discharge: 2016-09-15 | Disposition: A | Discharge status: Home / Self Care | Payer: Medicare Other | Attending: Emergency Medicine | Admitting: Emergency Medicine

## 2016-09-15 DIAGNOSIS — Z87891 Personal history of nicotine dependence: Secondary | ICD-10-CM | POA: Insufficient documentation

## 2016-09-15 DIAGNOSIS — Z79899 Other long term (current) drug therapy: Secondary | ICD-10-CM | POA: Diagnosis not present

## 2016-09-15 DIAGNOSIS — L02412 Cutaneous abscess of left axilla: Secondary | ICD-10-CM

## 2016-09-15 DIAGNOSIS — L02414 Cutaneous abscess of left upper limb: Secondary | ICD-10-CM | POA: Diagnosis present

## 2016-09-15 MED ORDER — DOXYCYCLINE HYCLATE 100 MG PO CAPS: 100.0000 mg | ORAL_CAPSULE | Freq: Two times a day (BID) | ORAL | 0 refills | Status: DC

## 2016-09-15 MED ORDER — TRAMADOL HCL 50 MG PO TABS: 50.0000 mg | ORAL_TABLET | Freq: Four times a day (QID) | ORAL | 0 refills | Status: DC | PRN

## 2016-09-15 MED ORDER — LIDOCAINE HCL (PF) 1 % IJ SOLN
5.0000 mL | Freq: Once | INTRAMUSCULAR | Status: AC
  Administered 2016-09-15: 5 mL
  Filled 2016-09-15: qty 5

## 2016-09-15 MED ORDER — IBUPROFEN 600 MG PO TABS: 600.0000 mg | ORAL_TABLET | Freq: Four times a day (QID) | ORAL | 0 refills | Status: DC | PRN

## 2016-09-15 MED ORDER — LIDOCAINE HCL 2 % EX GEL
1.0000 | Freq: Once | CUTANEOUS | Status: AC
Start: 2016-09-15 — End: 2016-09-15
  Administered 2016-09-15: 1 via TOPICAL
  Filled 2016-09-15: qty 20

## 2016-09-15 MED ORDER — DOXYCYCLINE HYCLATE 100 MG PO TABS
100.0000 mg | ORAL_TABLET | Freq: Once | ORAL | Status: AC
  Administered 2016-09-15: 100 mg via ORAL
  Filled 2016-09-15: qty 1

## 2016-09-15 MED ORDER — OXYCODONE-ACETAMINOPHEN 5-325 MG PO TABS
1.0000 | ORAL_TABLET | Freq: Once | ORAL | Status: AC
  Administered 2016-09-15: 1 via ORAL
  Filled 2016-09-15: qty 1

## 2016-09-15 NOTE — ED Provider Notes (Signed)
[facility]-EMERG[facility]Y DEPT Provider Note   CSN: [id number] Arrival date & time: 09/15/16  1643  By signing my name below, I, Soijett Blue, attest that this documentation has been prepared under the direction and in the presence of Kerrie BuffaloHope Bonnee Zertuche, NP Electronically Signed: Soijett Blue, ED Scribe. 09/15/16. 6:08 PM.   History   Chief Complaint Chief Complaint  Patient presents with  . Abscess    HPI Shawna PesterJessica Hunt is a 30 y.o. female who presents to the Emergency Department complaining of abscess to left axilla onset 1 week. Pt notes that she has a hx of abscess that always spontaneously resolve. Pt reports that the area opened for a short period of time, and it has now closed back over. Pt is having associated symptoms of redness. She notes that she has not tried warm compresses/soaks or medications for the relief of her symptoms. She denies fever, chills, drainage, and any other symptoms. Denies allergies to medications.    The history is provided by the patient. No language interpreter was used.  Abscess  Location:  Shoulder/arm Shoulder/arm abscess location:  L axilla Size:  3.5 cm Abscess quality: not draining and no redness   Red streaking: no   Duration:  1 week Progression:  Worsening Chronicity:  Recurrent Context: not diabetes   Relieved by:  None tried Exacerbated by: movement of left arm. Ineffective treatments:  None tried Associated symptoms: no fever   Risk factors: prior abscess     Past Medical History:  Diagnosis Date  . Depression    was taking a medicine a year ago doesn't remember the name  . Medical history non-contributory     Patient Active Problem List   Diagnosis Date Noted  . Cesarean delivery delivered 07/03/2016  . Group B Streptococcus carrier, +RV culture, currently pregnant 06/23/2016  . Iron deficiency anemia of pregnancy 04/15/2016  . Poor weight gain of pregnancy 03/28/2016  . Previous cesarean deliveryx 2, antepartum 12/27/2015  . Obesity  in pregnancy, antepartum 12/27/2015  . Marijuana smoker (HCC) 11/26/2015  . Supervision of normal pregnancy, antepartum 11/17/2015    Past Surgical History:  Procedure Laterality Date  . CESAREAN SECTION     two  . CESAREAN SECTION N/A 07/02/2016   Procedure: CESAREAN SECTION;  Surgeon: Willodean Rosenthalarolyn Harraway-Smith, MD;  [facility];  Service: Obstetrics;  Laterality: N/A;  . HERNIA REPAIR      OB History    Gravida Para Term Preterm AB Living   3 3 3  0 0 3   SAB TAB Ectopic Multiple Live Births   0 0 0 0 3      Home Medications    Prior to Admission medications   Medication Sig Start Date End Date Taking? Authorizing Provider  doxycycline (VIBRAMYCIN) 100 MG capsule Take 1 capsule (100 mg total) by mouth 2 (two) times daily. 09/15/16   Zonnie Landen Orlene OchM Kerrie Timm, NP  ibuprofen (ADVIL,MOTRIN) 600 MG tablet Take 1 tablet (600 mg total) by mouth every 6 (six) hours as needed. 09/15/16   Haila Dena Orlene OchM Jontavious Commons, NP  IRON PO Take 1 tablet by mouth daily.    Historical Provider, MD  oxyCODONE-acetaminophen (PERCOCET/ROXICET) 5-325 MG tablet Take 1 tablet by mouth every 4 (four) hours as needed. 07/05/16   Deirdre Colin Mulders Poe, CNM  prenatal vitamin w/FE, FA (NATACHEW) 29-1 MG CHEW chewable tablet Chew 1 tablet by mouth daily at 12 noon.    Historical Provider, MD  traMADol (ULTRAM) 50 MG tablet Take 1 tablet (50 mg total) by  mouth every 6 (six) hours as needed. 09/15/16   Angelo Caroll Orlene OchM Marina Desire, NP    Family History Family History  Problem Relation Age of Onset  . Hypertension Mother   . Hypertension Father   . Hypertension Maternal Aunt   . Diabetes Maternal Aunt   . Hypertension Paternal Aunt     Social History Social History  Substance Use Topics  . Smoking status: Former Smoker    Quit date: 12/19/2013  . Smokeless tobacco: Never Used  . Alcohol use Yes     Allergies   Patient has no known allergies.   Review of Systems Review of Systems  Constitutional: Negative for chills and fever.  Skin:  Negative for color change.       Abscess to left axilla without drainage.      Physical Exam Updated Vital Signs BP 122/59 (BP Location: Right Arm)   Pulse 92   Temp 98.2 F (36.8 C) (Oral)   Resp 16   SpO2 99%   Physical Exam  Constitutional: She is oriented to person, place, and time. She appears well-developed and well-nourished. No distress.  HENT:  Head: Normocephalic and atraumatic.  Eyes: EOM are normal.  Neck: Neck supple.  Cardiovascular: Normal rate.   Pulmonary/Chest: Effort normal. No respiratory distress.  Abdominal: She exhibits no distension.  Musculoskeletal:  Left axilla with raised tender area c/w abscess.   Neurological: She is alert and oriented to person, place, and time.  Skin: Skin is warm and dry.  3.5 cm raised, tender, fluctuant area to the left axilla  Psychiatric: She has a normal mood and affect. Her behavior is normal.  Nursing note and vitals reviewed.    ED Treatments / Results  DIAGNOSTIC STUDIES: Oxygen Saturation is 99% on RA, nl by my interpretation.    COORDINATION OF CARE: 5:56 PM Discussed treatment plan with pt at bedside which includes I&D, abx Rx, and pt agreed to plan.   Procedures .Marland Kitchen.Incision and Drainage Date/Time: 09/15/2016 6:29 PM Performed by: Janne NapoleonNEESE, Tiyonna Sardinha M Authorized by: Janne NapoleonNEESE, Undray Allman M   Consent:    Consent obtained:  Verbal   Consent given by:  Patient   Risks discussed:  Pain   Alternatives discussed:  No treatment Location:    Type:  Abscess   Size:  3.5 cm   Location:  Upper extremity   Upper extremity location: left axilla. Pre-procedure details:    Skin preparation:  Betadine Anesthesia (see MAR for exact dosages):    Anesthesia method:  Topical application and local infiltration   Topical anesthetic:  Lidocaine gel (2%)   Local anesthetic:  Lidocaine 1% w/o epi (5 cc used) Procedure type:    Complexity:  Complex Procedure details:    Needle aspiration: no     Incision types:  Single straight    Incision depth:  Dermal   Scalpel blade:  11   Wound management:  Probed and deloculated and irrigated with saline   Drainage:  Purulent   Drainage amount:  Copious   Wound treatment:  Wound left open   Packing materials:  None Post-procedure details:    Patient tolerance of procedure:  Tolerated well, no immediate complications Comments:     Percocet given to patient prior to procedure.      (including critical care time)  Medications Ordered in ED Medications  oxyCODONE-acetaminophen (PERCOCET/ROXICET) 5-325 MG per tablet 1 tablet (1 tablet Oral Given 09/15/16 1804)  lidocaine (XYLOCAINE) 2 % jelly 1 application (1 application Topical Given 09/15/16 1804)  lidocaine (PF) (XYLOCAINE) 1 % injection 5 mL (5 mLs Infiltration Given 09/15/16 1807)  doxycycline (VIBRA-TABS) tablet 100 mg (100 mg Oral Given 09/15/16 1854)     Initial Impression / Assessment and Plan / ED Course  I have reviewed the triage vital signs and the nursing notes.   Clinical Course     Patient with skin abscess. Incision and drainage performed in the ED today.  Abscess was not large enough to warrant packing or drain placement. Wound recheck in 2 days. Supportive care and return precautions discussed.  Pt sent home with doxycyclineRx. The patient appears reasonably screened and/or stabilized for discharge and I doubt any other emergent medical condition requiring further screening, evaluation, or treatment in the ED prior to discharge.   Final Clinical Impressions(s) / ED Diagnoses   Final diagnoses:  Abscess of left axilla    New Prescriptions Discharge Medication List as of 09/15/2016  6:48 PM    START taking these medications   Details  doxycycline (VIBRAMYCIN) 100 MG capsule Take 1 capsule (100 mg total) by mouth 2 (two) times daily., Starting Mon 09/15/2016, Print    traMADol (ULTRAM) 50 MG tablet Take 1 tablet (50 mg total) by mouth every 6 (six) hours as needed., Starting Mon 09/15/2016, Print         I personally performed the services described in this documentation, which was scribed in my presence. The recorded information has been reviewed and is accurate.     [address] [place], NP 09/17/16 0129    Rolland Porter, MD 10/07/16 [phone]

## 2016-09-15 NOTE — Discharge Instructions (Signed)
Do not drive while taking the narcotic pain medication as it will make you sleepy.  Apply warm wet compresses to the area several times a day.

## 2016-09-15 NOTE — ED Notes (Signed)
Declined W/C at D/C and was escorted to lobby by RN. 

## 2016-09-15 NOTE — ED Triage Notes (Signed)
Pt reports to the ED for eval of abscess to the left axilla. Pt has hx of abscesses but they usually spontaneously resolve however this one did not. States it did open for a short time but it closed back over. Denies any N/V or fevers.

## 2016-09-28 ENCOUNTER — Emergency Department (HOSPITAL_COMMUNITY)
Admission: EM | Admit: 2016-09-28 | Discharge: 2016-09-28 | Disposition: A | Discharge status: Home / Self Care | Payer: Medicare Other | Attending: Emergency Medicine | Admitting: Emergency Medicine

## 2016-09-28 ENCOUNTER — Encounter (HOSPITAL_COMMUNITY): Payer: Self-pay

## 2016-09-28 DIAGNOSIS — Z79899 Other long term (current) drug therapy: Secondary | ICD-10-CM | POA: Insufficient documentation

## 2016-09-28 DIAGNOSIS — Z87891 Personal history of nicotine dependence: Secondary | ICD-10-CM | POA: Diagnosis not present

## 2016-09-28 DIAGNOSIS — Z113 Encounter for screening for infections with a predominantly sexual mode of transmission: Secondary | ICD-10-CM | POA: Insufficient documentation

## 2016-09-28 DIAGNOSIS — A599 Trichomoniasis, unspecified: Secondary | ICD-10-CM

## 2016-09-28 LAB — URINE MICROSCOPIC-ADD ON: RBC / HPF: NONE SEEN RBC/hpf (ref 0–5)

## 2016-09-28 LAB — URINALYSIS, ROUTINE W REFLEX MICROSCOPIC
BILIRUBIN URINE: NEGATIVE
GLUCOSE, UA: NEGATIVE mg/dL
Hgb urine dipstick: NEGATIVE
KETONES UR: NEGATIVE mg/dL
NITRITE: NEGATIVE
PH: 5.5 (ref 5.0–8.0)
PROTEIN: NEGATIVE mg/dL
Specific Gravity, Urine: 1.02 (ref 1.005–1.030)

## 2016-09-28 LAB — WET PREP, GENITAL
CLUE CELLS WET PREP: NONE SEEN
SPERM: NONE SEEN
YEAST WET PREP: NONE SEEN

## 2016-09-28 LAB — PREGNANCY, URINE: Preg Test, Ur: NEGATIVE

## 2016-09-28 MED ORDER — METRONIDAZOLE 500 MG PO TABS
2000.0000 mg | ORAL_TABLET | Freq: Once | ORAL | Status: AC
  Administered 2016-09-28: 2000 mg via ORAL
  Filled 2016-09-28: qty 4

## 2016-09-28 MED ORDER — ONDANSETRON 4 MG PO TBDP
8.0000 mg | ORAL_TABLET | Freq: Once | ORAL | Status: AC
  Administered 2016-09-28: 8 mg via ORAL
  Filled 2016-09-28: qty 2

## 2016-09-28 MED ORDER — ONDANSETRON 4 MG PO TBDP: 4.0000 mg | ORAL_TABLET | Freq: Three times a day (TID) | ORAL | 0 refills | Status: DC | PRN

## 2016-09-28 MED ORDER — METRONIDAZOLE 500 MG PO TABS: 500.0000 mg | ORAL_TABLET | Freq: Two times a day (BID) | ORAL | 0 refills | Status: DC

## 2016-09-28 NOTE — Discharge Instructions (Addendum)
The results of your testing shows trichomonas infection. You have been treated her in the ED and require no further antibiotics. Please avoid breastfeeding and alcohol use for 24 hours. Follow up with your PCP or call to schedule reevaluation [facility] for results of your gonorrhea/chlamydia, HIV, and syphilis testing.

## 2016-09-28 NOTE — ED Provider Notes (Signed)
[facility] Provider Note   CSN: [id number] Arrival date & time: 09/28/16  [id number]     History   Chief Complaint No chief complaint on file.   HPI Shawna Hunt is a 30 y.o. female.  Patient is 30 yo F with PMH as listed below, no history of STDs, presenting for STD check after her partner told her he had trichomonas. Patient was last sexually active with him 1 week ago. She denies any fevers, chills, abdominal pain, N/V/D, dysuria, vaginal bleeding, or discharge. She denies any chance she could be pregnant, with LMP 2 weeks ago. No other complaints. She does have a PCP that she sees regularly but no OBGYN.      Past Medical History:  Diagnosis Date  . Depression    was taking a medicine a year ago doesn't remember the name  . Medical history non-contributory     Patient Active Problem List   Diagnosis Date Noted  . Cesarean delivery delivered 07/03/2016  . Group B Streptococcus carrier, +RV culture, currently pregnant 06/23/2016  . Iron deficiency anemia of pregnancy 04/15/2016  . Poor weight gain of pregnancy 03/28/2016  . Previous cesarean deliveryx 2, antepartum 12/27/2015  . Obesity in pregnancy, antepartum 12/27/2015  . Marijuana smoker (HCC) 11/26/2015  . Supervision of normal pregnancy, antepartum 11/17/2015    Past Surgical History:  Procedure Laterality Date  . CESAREAN SECTION     two  . CESAREAN SECTION N/A 07/02/2016   Procedure: CESAREAN SECTION;  Surgeon: Willodean Rosenthalarolyn Harraway-Smith, MD;  [facility];  Service: Obstetrics;  Laterality: N/A;  . HERNIA REPAIR      OB History    Gravida Para Term Preterm AB Living   3 3 3  0 0 3   SAB TAB Ectopic Multiple Live Births   0 0 0 0 3       Home Medications    Prior to Admission medications   Medication Sig Start Date End Date Taking? Authorizing Provider  doxycycline (VIBRAMYCIN) 100 MG capsule Take 1 capsule (100 mg total) by mouth 2 (two) times daily. Patient not taking:  Reported on 09/28/2016 09/15/16   Janne NapoleonHope M Neese, NP  ibuprofen (ADVIL,MOTRIN) 600 MG tablet Take 1 tablet (600 mg total) by mouth every 6 (six) hours as needed. Patient not taking: Reported on 09/28/2016 09/15/16   Janne NapoleonHope M Neese, NP  oxyCODONE-acetaminophen (PERCOCET/ROXICET) 5-325 MG tablet Take 1 tablet by mouth every 4 (four) hours as needed. Patient not taking: Reported on 09/28/2016 07/05/16   Deirdre Colin Mulders Poe, CNM  traMADol (ULTRAM) 50 MG tablet Take 1 tablet (50 mg total) by mouth every 6 (six) hours as needed. Patient not taking: Reported on 09/28/2016 09/15/16   Janne NapoleonHope M Neese, NP    Family History Family History  Problem Relation Age of Onset  . Hypertension Mother   . Hypertension Father   . Hypertension Maternal Aunt   . Diabetes Maternal Aunt   . Hypertension Paternal Aunt     Social History Social History  Substance Use Topics  . Smoking status: Former Smoker    Quit date: 12/19/2013  . Smokeless tobacco: Never Used  . Alcohol use Yes     Allergies   Patient has no known allergies.   Review of Systems Review of Systems  Constitutional: Negative for chills and fever.  Gastrointestinal: Negative for abdominal pain, blood in stool, diarrhea, nausea and vomiting.  Genitourinary: Negative for dysuria, flank pain, genital sores, hematuria, pelvic pain, vaginal bleeding and vaginal discharge.  Musculoskeletal: Negative for back pain and neck pain.  Skin: Negative for color change and rash.  Neurological: Negative for dizziness, seizures, syncope, weakness, numbness and headaches.     Physical Exam Updated Vital Signs BP 113/69 (BP Location: Right Arm)   Pulse (!) 53   Temp 97.4 F (36.3 C)   Resp 16   SpO2 95%   Physical Exam  Constitutional: She appears well-developed and well-nourished. No distress.  HENT:  Head: Normocephalic and atraumatic.  Mouth/Throat: Oropharynx is clear and moist.  Eyes: Conjunctivae are normal.  Neck: Normal range of motion.    Cardiovascular: Normal rate.   Pulmonary/Chest: Effort normal. No respiratory distress.  Genitourinary:  Genitourinary Comments: Female chaperone present for exam. No rashes, lesions, or tenderness to external genitalia. No erythema, injury, or tenderness to vaginal mucosa. No vaginal discharge or bleeding within vaginal vault. No adnexal masses, tenderness, or fullness. No CMT, cervical friability, or discharge from cervical os.  Musculoskeletal: Normal range of motion.  Neurological: She is alert.  Skin: Skin is warm and dry.  Psychiatric: She has a normal mood and affect.  Nursing note and vitals reviewed.    ED Treatments / Results  Labs (all labs ordered are listed, but only abnormal results are displayed) Labs Reviewed  URINALYSIS, ROUTINE W REFLEX MICROSCOPIC (NOT AT [facility]) - Abnormal; Notable for the following:       Result Value   APPearance CLOUDY (*)    Leukocytes, UA MODERATE (*)    All other components within normal limits  URINE MICROSCOPIC-ADD ON - Abnormal; Notable for the following:    Squamous Epithelial / LPF 0-5 (*)    Bacteria, UA FEW (*)    All other components within normal limits  WET PREP, GENITAL  URINE CULTURE  PREGNANCY, URINE  RPR  HIV ANTIBODY (ROUTINE TESTING)  GC/CHLAMYDIA PROBE AMP ([place]) NOT AT [facility]    EKG  EKG Interpretation None       Radiology No results found.  Procedures Procedures (including critical care time)  Medications Ordered in ED Medications - No data to display   Initial Impression / Assessment and Plan / ED Course  I have reviewed the triage vital signs and the nursing notes.  Pertinent labs & imaging results that were available during my care of the patient were reviewed by me and considered in my medical decision making (see chart for details).  Clinical Course    Patient is 30 yo F presenting for STD check after her partner told her he had trichomonas. Patient is asymptomatic and pelvic exam  unremarkable, with no evidence of discharge concerning for GC/chlamydia. However, wet prep and urine both showed presence of trichomonas. Urinalysis also had moderate leukocytes. Urine sent for culture, but given patient is asymptomatic, no antibiotics indicated at this time. Patient given 2 g Flagyl with Zofran to treat trichomonas, and stable for d/c home with f/u to PCP and [facility] for results of HIV, RPR, and GC/chlamydia testing.   Final Clinical Impressions(s) / ED Diagnoses   Final diagnoses:  Trichomonas infection  Screening examination for STD (sexually transmitted disease)    New Prescriptions New Prescriptions   No medications on file     Jari PiggDaryl F de Villier II, [place] 09/28/16 2056    Maia PlanJoshua G Long, MD 09/29/16 1325

## 2016-09-28 NOTE — ED Notes (Signed)
Pelvic cart at bedside. 

## 2016-09-28 NOTE — ED Triage Notes (Signed)
Patient requesting to be checked for STD. Partner told her she has trich. Patient denies symptoms, NAD

## 2016-09-29 LAB — GC/CHLAMYDIA PROBE AMP (~~LOC~~) NOT AT ARMC
Chlamydia: NEGATIVE
NEISSERIA GONORRHEA: NEGATIVE

## 2016-09-29 LAB — URINE CULTURE

## 2016-09-29 LAB — RPR: RPR: NONREACTIVE

## 2016-10-01 LAB — HIV ANTIBODY (ROUTINE TESTING W REFLEX): HIV Screen 4th Generation wRfx: NONREACTIVE

## 2018-01-18 ENCOUNTER — Encounter: Payer: Self-pay | Admitting: *Deleted

## 2018-02-12 ENCOUNTER — Inpatient Hospital Stay (HOSPITAL_COMMUNITY): Payer: Medicare Other

## 2018-02-12 ENCOUNTER — Encounter (HOSPITAL_COMMUNITY): Payer: Self-pay

## 2018-02-12 ENCOUNTER — Inpatient Hospital Stay (HOSPITAL_COMMUNITY)
Admission: AD | Admit: 2018-02-12 | Discharge: 2018-02-12 | Disposition: A | Discharge status: Home / Self Care | Payer: Medicare Other | Source: Ambulatory Visit | Attending: Obstetrics and Gynecology | Admitting: Obstetrics and Gynecology

## 2018-02-12 DIAGNOSIS — O26899 Other specified pregnancy related conditions, unspecified trimester: Secondary | ICD-10-CM

## 2018-02-12 DIAGNOSIS — R1031 Right lower quadrant pain: Secondary | ICD-10-CM | POA: Insufficient documentation

## 2018-02-12 DIAGNOSIS — O26892 Other specified pregnancy related conditions, second trimester: Secondary | ICD-10-CM | POA: Insufficient documentation

## 2018-02-12 DIAGNOSIS — Z3A20 20 weeks gestation of pregnancy: Secondary | ICD-10-CM

## 2018-02-12 DIAGNOSIS — R109 Unspecified abdominal pain: Secondary | ICD-10-CM

## 2018-02-12 DIAGNOSIS — N760 Acute vaginitis: Secondary | ICD-10-CM

## 2018-02-12 DIAGNOSIS — Z87891 Personal history of nicotine dependence: Secondary | ICD-10-CM | POA: Insufficient documentation

## 2018-02-12 DIAGNOSIS — N949 Unspecified condition associated with female genital organs and menstrual cycle: Secondary | ICD-10-CM

## 2018-02-12 DIAGNOSIS — B9689 Other specified bacterial agents as the cause of diseases classified elsewhere: Secondary | ICD-10-CM | POA: Diagnosis present

## 2018-02-12 HISTORY — DX: Unspecified ovarian cyst, unspecified side: N83.209

## 2018-02-12 HISTORY — DX: Anemia, unspecified: D64.9

## 2018-02-12 HISTORY — DX: Anxiety disorder, unspecified: F41.9

## 2018-02-12 LAB — WET PREP, GENITAL
SPERM: NONE SEEN
Trich, Wet Prep: NONE SEEN
YEAST WET PREP: NONE SEEN

## 2018-02-12 LAB — URINALYSIS, ROUTINE W REFLEX MICROSCOPIC
BACTERIA UA: NONE SEEN
BILIRUBIN URINE: NEGATIVE
Glucose, UA: NEGATIVE mg/dL
HGB URINE DIPSTICK: NEGATIVE
Ketones, ur: NEGATIVE mg/dL
NITRITE: NEGATIVE
PROTEIN: NEGATIVE mg/dL
SPECIFIC GRAVITY, URINE: 1.026 (ref 1.005–1.030)
pH: 6 (ref 5.0–8.0)

## 2018-02-12 LAB — POCT PREGNANCY, URINE: PREG TEST UR: POSITIVE — AB

## 2018-02-12 MED ORDER — METRONIDAZOLE 500 MG PO TABS: 500.0000 mg | ORAL_TABLET | Freq: Two times a day (BID) | ORAL | 0 refills | Status: AC

## 2018-02-12 MED ORDER — COMFORT FIT MATERNITY SUPP LG MISC: 1.0000 [IU] | Freq: Every day | 0 refills | Status: DC

## 2018-02-12 NOTE — MAU Provider Note (Signed)
History     CSN: [id number]  Arrival date and time: 02/12/18 1639   First Provider Initiated Contact with Patient 02/12/18 1723      Chief Complaint  Patient presents with  . Abdominal Pain  . Dizziness   HPI  Ms.  Shawna Hunt is a 32 y.o. year old [id number] female at [redacted]w[redacted]d weeks gestation by unsure LMP who presents to MAU reporting RLQ/R groin abdominal pain onset today at 1:30 PM when patient was getting out of a car. She tried resting with no improvement. Did not try taking anything for pain. Pain is currently 8/10, increases with movement to 10/10, described as "sharp." Similar episode of pain 1 month ago that resolved after 30 minutes without treatment. +generalized weakness/fatigue onset this morning. LMP: ?10/05/2018 - patient is uncertain about this date but denies any bleeding since then.  Also c/o dizziness with standing, nausea/vomiting x months. Has been vomiting 3 times a day.   She denies vaginal bleeding, vaginal itching/burning, urinary symptoms, h/a,or fever/chills.    .  Past Medical History:  Diagnosis Date  . Anemia   . Anxiety   . Depression    was taking a medicine a year ago doesn't remember the name  . Medical history non-contributory   . Ovarian cyst     Past Surgical History:  Procedure Laterality Date  . CESAREAN SECTION     two  . CESAREAN SECTION N/A 07/02/2016   Procedure: CESAREAN SECTION;  Surgeon: Willodean Rosenthal, MD;  Location[facility];  Service: Obstetrics;  Laterality: N/A;  . HERNIA REPAIR      Family History  Problem Relation Age of Onset  . Hypertension Mother   . Hypertension Father   . Hypertension Maternal Aunt   . Diabetes Maternal Aunt   . Hypertension Paternal Aunt     Social History   Tobacco Use  . Smoking status: Former Smoker    Last attempt to quit: 12/19/2013    Years since quitting: 4.1  . Smokeless tobacco: Never Used  Substance Use Topics  . Alcohol use: Yes  . Drug use: Yes   Frequency: 3.0 times per week    Types: Marijuana    Comment: last used in april as of 06/25/2016    Allergies: No Known Allergies  Medications Prior to Admission  Medication Sig Dispense Refill Last Dose  . Prenatal Vit-Fe Fumarate-FA (PRENATAL MULTIVITAMIN) TABS tablet Take 1 tablet by mouth daily at 12 noon.   02/11/2018 at Unknown time    Review of Systems  Constitutional: Positive for fatigue.  HENT: Negative.   Eyes: Negative.   Respiratory: Negative.   Cardiovascular: Negative.   Gastrointestinal: Positive for abdominal pain, nausea and vomiting.  Endocrine: Negative.   Genitourinary: Positive for pelvic pain.  Musculoskeletal: Negative.   Skin: Negative.   Allergic/Immunologic: Negative.   Neurological: Positive for dizziness and weakness (generalized).  Hematological: Negative.   Psychiatric/Behavioral: Negative.    Physical Exam   Blood pressure (!) 117/54, pulse 66, temperature 97.9 F (36.6 C), resp. rate 17, height 5\' 8"  (1.727 m), weight 256 lb 1.3 oz (116.2 kg), SpO2 100 %.  Physical Exam  Constitutional: She is oriented to person, place, and time. She appears well-developed and well-nourished.  HENT:  Head: Normocephalic and atraumatic.  Eyes: Pupils are equal, round, and reactive to light.  Neck: Normal range of motion. Neck supple.  Cardiovascular: Normal rate, regular rhythm, normal heart sounds and intact distal pulses.  Respiratory: Effort normal and breath sounds  normal.  GI: Soft. Bowel sounds are normal.  Genitourinary:  Genitourinary Comments: Uterus: difficult to determine d/t body habitus, S?D, SE: cervix is smooth, pink, no lesions, small amt of thick, white vaginal d/c -- WP, GC/CT done, closed/long/firm, no CMT or friability, no adnexal tenderness   Musculoskeletal: Normal range of motion.  Neurological: She is alert and oriented to person, place, and time.  Skin: Skin is warm and dry.  Psychiatric: She has a normal mood and affect. Her  behavior is normal. Judgment and thought content normal.    MAU Course  Procedures  MDM CCUA UPT Wet Prep GC/CT -- pending HIV -- pending  Results for orders placed or performed during the hospital encounter of 02/12/18 (from the past 72 hour(s))  Urinalysis, Routine w reflex microscopic     Status: Abnormal   Collection Time: 02/12/18  4:50 PM  Result Value Ref Range   Color, Urine YELLOW YELLOW   APPearance HAZY (A) CLEAR   Specific Gravity, Urine 1.026 1.005 - 1.030   pH 6.0 5.0 - 8.0   Glucose, UA NEGATIVE NEGATIVE mg/dL   Hgb urine dipstick NEGATIVE NEGATIVE   Bilirubin Urine NEGATIVE NEGATIVE   Ketones, ur NEGATIVE NEGATIVE mg/dL   Protein, ur NEGATIVE NEGATIVE mg/dL   Nitrite NEGATIVE NEGATIVE   Leukocytes, UA SMALL (A) NEGATIVE   RBC / HPF 0-5 0 - 5 RBC/hpf   WBC, UA TOO NUMEROUS TO COUNT 0 - 5 WBC/hpf   Bacteria, UA NONE SEEN NONE SEEN   Squamous Epithelial / LPF 0-5 (A) NONE SEEN   Mucus PRESENT     Comment: Performed at [facility], [address] [place], [place] [address]  Pregnancy, urine POC     Status: Abnormal   Collection Time: 02/12/18  5:24 PM  Result Value Ref Range   Preg Test, Ur POSITIVE (A) NEGATIVE    Comment:        THE SENSITIVITY OF THIS METHODOLOGY IS >24 mIU/mL   Wet prep, genital     Status: Abnormal   Collection Time: 02/12/18  6:07 PM  Result Value Ref Range   Yeast Wet Prep HPF POC NONE SEEN NONE SEEN   Trich, Wet Prep NONE SEEN NONE SEEN   Clue Cells Wet Prep HPF POC PRESENT (A) NONE SEEN   WBC, Wet Prep HPF POC FEW (A) NONE SEEN    Comment: MANY BACTERIA SEEN   Sperm NONE SEEN     Comment: Performed at [facility], [address] [place], [place] [address]    OB 14+ wks Limited US: Transverse presentation / placenta anterior above the cervical os / AFI WNL / BEDC by BPD 20 w 2 d / CL 3.9 cm     Assessment and Plan  Round ligament pain  - Information provided on RLP - Advised to take Tylenol 100 mg every 6 hrs  prn pain   Bacterial Vaginitis - Rx for Flagyl 500 mg BID x 7 days - Information provided on BV  - Discharge patient [facility] - Patient verbalized an understanding of the plan of care and agrees.    Shawna Moraolitta Daison Braxton, MSN, CNM 02/12/2018, 7:18 PM

## 2018-02-12 NOTE — Discharge Instructions (Signed)

## 2018-02-12 NOTE — MAU Note (Signed)
[facility], onset of lower abdominal pain after getting out of her car, rested but it did not go away.    Dizziness upon standing when getting up too quickly.

## 2018-02-12 NOTE — MAU Provider Note (Signed)
Chief Complaint: Abdominal Pain and Dizziness   First Provider Initiated Contact with Patient 02/12/18 1723      SUBJECTIVE HPI: Shawna Hunt is a 32 y.o. G4P3003 at Unknown by LMP who presents to maternity admissions reporting RLQ/R groin abdominal pain onset today at 1:30 PM when patient was getting out of a car. She tried resting with no improvement. Did not try taking anything for pain. Pain is currently 8/10, increases with movement to 10/10, described as "sharp." Similar episode of pain 1 month ago that resolved after 30 minutes without treatment. +generalized weakness/fatigue onset this morning. LMP: ?10/05/2018 - patient is uncertain about this date but denies any bleeding since then.  Also c/o dizziness with standing, nausea/vomiting x months. Has been vomiting 3 times a day.   She denies vaginal bleeding, vaginal itching/burning, urinary symptoms, h/a,or fever/chills.     HPI  Past Medical History:  Diagnosis Date  . Anemia   . Anxiety   . Depression    was taking a medicine a year ago doesn't remember the name  . Medical history non-contributory   . Ovarian cyst    Past Surgical History:  Procedure Laterality Date  . CESAREAN SECTION     two  . CESAREAN SECTION N/A 07/02/2016   Procedure: CESAREAN SECTION;  Surgeon: Willodean Rosenthal, MD;  Location: [facility] BIRTHING SUITES;  Service: Obstetrics;  Laterality: N/A;  . HERNIA REPAIR     Social History   Socioeconomic History  . Marital status: Single    Spouse name: Not on file  . Number of children: Not on file  . Years of education: Not on file  . Highest education level: Not on file  Occupational History  . Not on file  Social Needs  . Financial resource strain: Not on file  . Food insecurity:    Worry: Not on file    Inability: Not on file  . Transportation needs:    Medical: Not on file    Non-medical: Not on file  Tobacco Use  . Smoking status: Former Smoker    Last attempt to quit: 12/19/2013    Years  since quitting: 4.1  . Smokeless tobacco: Never Used  Substance and Sexual Activity  . Alcohol use: Yes  . Drug use: Yes    Frequency: 3.0 times per week    Types: Marijuana    Comment: last used in april as of 06/25/2016  . Sexual activity: Yes    Birth control/protection: None, Condom  Lifestyle  . Physical activity:    Days per week: Not on file    Minutes per session: Not on file  . Stress: Not on file  Relationships  . Social connections:    Talks on phone: Not on file    Gets together: Not on file    Attends religious service: Not on file    Active member of club or organization: Not on file    Attends meetings of clubs or organizations: Not on file    Relationship status: Not on file  . Intimate partner violence:    Fear of current or ex partner: Not on file    Emotionally abused: Not on file    Physically abused: Not on file    Forced sexual activity: Not on file  Other Topics Concern  . Not on file  Social History Narrative  . Not on file   No current facility-administered medications on file prior to encounter.    Current Outpatient Medications on File Prior to  Encounter  Medication Sig Dispense Refill  . Prenatal Vit-Fe Fumarate-FA (PRENATAL MULTIVITAMIN) TABS tablet Take 1 tablet by mouth daily at 12 noon.     No Known Allergies  ROS:  Review of Systems  Constitutional: Positive for fatigue. Negative for chills and fever.  Eyes: Negative for visual disturbance.  Cardiovascular: Negative for chest pain.  Gastrointestinal: Positive for abdominal pain, nausea and vomiting. Negative for constipation and diarrhea.  Genitourinary: Negative for dysuria, hematuria, vaginal bleeding and vaginal discharge.  Neurological: Positive for dizziness and weakness (generalized). Negative for headaches.  All other systems reviewed and are negative.    I have reviewed patient's Past Medical Hx, Surgical Hx, Family Hx, Social Hx, medications and allergies.   Physical Exam    Patient Vitals for the past 24 hrs:  BP Temp Pulse Resp SpO2 Height Weight  02/12/18 1701 (!) 117/54 97.9 F (36.6 C) 66 17 100 % - -  02/12/18 1656 - - - - - 5\' 8"  (1.727 m) 116.2 kg (256 lb 1.3 oz)   Constitutional: Well-developed, well-nourished female in no acute distress.  Cardiovascular: normal rate Respiratory: normal effort GI: Limited due to body habitus. Abd soft, generalized tenderness to palpation, worst in RLQ. Pos BS x 4. Fundal height not able to be determined.  MS: Extremities nontender, no edema, normal ROM Neurologic: Alert and oriented x 4.  GU: Neg CVAT.   PELVIC EXAM: Cervix pink, visually closed, white creamy discharge with slight odor, vaginal walls and external genitalia normal Bimanual exam: cervix closed/thick. Fundal height unable to be appreciated.   FHT unable to find by doppler  LAB RESULTS Results for orders placed or performed during the hospital encounter of 02/12/18 (from the past 24 hour(s))  Urinalysis, Routine w reflex microscopic     Status: Abnormal   Collection Time: 02/12/18  4:50 PM  Result Value Ref Range   Color, Urine YELLOW YELLOW   APPearance HAZY (A) CLEAR   Specific Gravity, Urine 1.026 1.005 - 1.030   pH 6.0 5.0 - 8.0   Glucose, UA NEGATIVE NEGATIVE mg/dL   Hgb urine dipstick NEGATIVE NEGATIVE   Bilirubin Urine NEGATIVE NEGATIVE   Ketones, ur NEGATIVE NEGATIVE mg/dL   Protein, ur NEGATIVE NEGATIVE mg/dL   Nitrite NEGATIVE NEGATIVE   Leukocytes, UA SMALL (A) NEGATIVE   RBC / HPF 0-5 0 - 5 RBC/hpf   WBC, UA TOO NUMEROUS TO COUNT 0 - 5 WBC/hpf   Bacteria, UA NONE SEEN NONE SEEN   Squamous Epithelial / LPF 0-5 (A) NONE SEEN   Mucus PRESENT   Pregnancy, urine POC     Status: Abnormal   Collection Time: 02/12/18  5:24 PM  Result Value Ref Range   Preg Test, Ur POSITIVE (A) NEGATIVE  Wet prep, genital     Status: Abnormal   Collection Time: 02/12/18  6:07 PM  Result Value Ref Range   Yeast Wet Prep HPF POC NONE SEEN NONE  SEEN   Trich, Wet Prep NONE SEEN NONE SEEN   Clue Cells Wet Prep HPF POC PRESENT (A) NONE SEEN   WBC, Wet Prep HPF POC FEW (A) NONE SEEN   Sperm NONE SEEN        IMAGING No results found.  MAU Management/MDM: Orders Placed This Encounter  Procedures  . Wet prep, genital  . US MFM OB LIMITED  . Urinalysis, Routine w reflex microscopic  . Orthostatic vital signs  . Pregnancy, urine POC  . Discharge patient    Meds ordered  this encounter  Medications  . metroNIDAZOLE (FLAGYL) 500 MG tablet    Sig: Take 1 tablet (500 mg total) by mouth 2 (two) times daily for 7 days.    Dispense:  14 tablet    Refill:  0    Order Specific Question:   Supervising Provider    Answer:   Reva Bores [[id number]]  . Elastic Bandages & Supports (COMFORT FIT MATERNITY SUPP LG) MISC    Sig: 1 Units by Does not apply route daily.    Dispense:  1 each    Refill:  0    Order Specific Question:   Supervising Provider    Answer:   Samara Snide   [place] as patient's gestational dating is uncertain.   Will tx for BV. By [place] patient is [redacted]w[redacted]d. UA unremarkable. Patient will keep her f/u appointments at the [facility]. Explained all test results. Rx for Metronidazole, maternity support belt, and PNV. List of OTC meds safe in pregnancy given and handouts on BV and round ligament pain.   ASSESSMENT 1. Round ligament pain   2. Abdominal pain affecting pregnancy   3. [redacted] weeks gestation of pregnancy     PLAN Discharge home  Follow-up Information    Department, [facility]. Go to.   Why:  OB visit as scheduled Contact information: [address] [place] [address] [phone]           Allergies as of 02/12/2018   No Known Allergies     Medication List    TAKE these medications   COMFORT FIT MATERNITY SUPP LG Misc 1 Units by Does not apply route daily.   metroNIDAZOLE 500 MG tablet Commonly known as:  FLAGYL Take 1 tablet (500 mg total) by mouth 2 (two) times daily for 7  days.   prenatal multivitamin Tabs tablet Take 1 tablet by mouth daily at 12 noon.       Felicie Morn, Medical Student 02/12/2018  7:26 PM

## 2018-02-15 LAB — GC/CHLAMYDIA PROBE AMP (~~LOC~~) NOT AT ARMC
CHLAMYDIA, DNA PROBE: NEGATIVE
Neisseria Gonorrhea: NEGATIVE

## 2018-02-22 LAB — OB RESULTS CONSOLE GC/CHLAMYDIA
Chlamydia: NEGATIVE
Gonorrhea: NEGATIVE

## 2018-02-22 LAB — OB RESULTS CONSOLE ANTIBODY SCREEN: Antibody Screen: NEGATIVE

## 2018-02-22 LAB — OB RESULTS CONSOLE ABO/RH: RH TYPE: POSITIVE

## 2018-02-22 LAB — OB RESULTS CONSOLE RUBELLA ANTIBODY, IGM: RUBELLA: IMMUNE

## 2018-02-22 LAB — OB RESULTS CONSOLE RPR: RPR: NONREACTIVE

## 2018-02-22 LAB — OB RESULTS CONSOLE HEPATITIS B SURFACE ANTIGEN: Hepatitis B Surface Ag: NEGATIVE

## 2018-02-22 LAB — OB RESULTS CONSOLE HIV ANTIBODY (ROUTINE TESTING): HIV: NONREACTIVE

## 2018-04-06 ENCOUNTER — Encounter (HOSPITAL_COMMUNITY): Payer: Self-pay

## 2018-04-07 ENCOUNTER — Other Ambulatory Visit: Payer: Self-pay | Admitting: Obstetrics and Gynecology

## 2018-04-08 ENCOUNTER — Other Ambulatory Visit: Payer: Self-pay | Admitting: Obstetrics and Gynecology

## 2018-04-08 DIAGNOSIS — Z98891 History of uterine scar from previous surgery: Secondary | ICD-10-CM | POA: Insufficient documentation

## 2018-04-08 MED ORDER — SOD CITRATE-CITRIC ACID 500-334 MG/5ML PO SOLN: 30.0000 mL | ORAL | Status: DC

## 2018-04-08 MED ORDER — CEFAZOLIN SODIUM 1 G IJ SOLR: 2.0000 g | INTRAMUSCULAR | Status: DC

## 2018-04-09 ENCOUNTER — Telehealth: Payer: Self-pay

## 2018-04-09 NOTE — Telephone Encounter (Signed)
Rcvd call from Sangita in labs stating that she could not process the lab order for Type and screen, that was ordered on 04/08/18. She asked if it could be canceled, Epic would not allow me to cancel directly. So advised Sangita, she states that she could cancel but I would have to be listed as the responsible person. Please contact her at [phone] or [phone] for any questions.

## 2018-04-15 ENCOUNTER — Telehealth (HOSPITAL_COMMUNITY): Payer: Self-pay

## 2018-04-15 NOTE — Telephone Encounter (Signed)
Received a call from Shanda BumpsJessica wanting her surgery to be moved from 08/14 to 08/13. Attempted to explain to her that I could not move her surgery because our policy is to schedule repeat c-setions at 39.0wks or later and she would only be 38.6wks on that day. Advised patient to talk to her doctor at her next visit and he could better explain to her why we wait. Shanda BumpsJessica goes on to talk over me and say I was being rude and she wanted to talk to someone else, explain to her that I am the only person who does surgery scheduling and the decision to move her surgery would need to come directly from her doctor. Shanda BumpsJessica hangs up the phone. Called back maybe a minute later asking for a manager, patient was given the number to patient experience, my name and my directors name (continues to talk over me as I try to give her this information). Hangs up the phone again.

## 2018-06-09 ENCOUNTER — Encounter (HOSPITAL_COMMUNITY): Payer: Self-pay

## 2018-06-22 ENCOUNTER — Encounter (HOSPITAL_COMMUNITY)
Admission: RE | Admit: 2018-06-22 | Discharge: 2018-06-22 | Disposition: A | Discharge status: Home / Self Care | Payer: Medicare Other | Source: Ambulatory Visit | Attending: Obstetrics and Gynecology | Admitting: Obstetrics and Gynecology

## 2018-06-22 LAB — CBC
HCT: 28.8 % — ABNORMAL LOW (ref 36.0–46.0)
HEMOGLOBIN: 9.5 g/dL — AB (ref 12.0–15.0)
MCH: 24.1 pg — ABNORMAL LOW (ref 26.0–34.0)
MCHC: 33 g/dL (ref 30.0–36.0)
MCV: 72.9 fL — ABNORMAL LOW (ref 78.0–100.0)
PLATELETS: 212 10*3/uL (ref 150–400)
RBC: 3.95 MIL/uL (ref 3.87–5.11)
RDW: 15.7 % — ABNORMAL HIGH (ref 11.5–15.5)
WBC: 6.6 10*3/uL (ref 4.0–10.5)

## 2018-06-22 LAB — TYPE AND SCREEN
ABO/RH(D): B POS
ANTIBODY SCREEN: NEGATIVE

## 2018-06-22 NOTE — Patient Instructions (Signed)
Shawna ConnersJessica A Hunt  06/22/2018   Your procedure is scheduled on:  06/23/2018  [facility] at 0815 AM.  Pick up the phone at the desk and dial [phone]  Call this number if you have problems the morning of [phone]  Remember:   Do not eat food:(After Midnight) Desps de medianoche.  Do not drink clear liquids: (After Midnight) Desps de medianoche.  Take these medicines the morning of surgery with A SIP OF WATER: none   Do not wear jewelry, make-up or nail polish.  Do not wear lotions, powders, or perfumes. Do not wear deodorant.  Do not shave 48 hours prior to surgery.  Do not bring valuables to the hospital.  [facility] is not   responsible for any belongings or valuables brought to the hospital.  Contacts, dentures or bridgework may not be worn into surgery.  Leave suitcase in the car. After surgery it may be brought to your room.  For patients admitted to the hospital, checkout time is 11:00 AM the day of              discharge.    N/A   Please read over the following fact sheets that you were given:   Surgical Site Infection Prevention

## 2018-06-22 NOTE — Anesthesia Preprocedure Evaluation (Signed)
Anesthesia Evaluation  Patient identified by MRN, date of birth, ID band Patient awake    Reviewed: Allergy & Precautions, NPO status , Patient's Chart, lab work & pertinent test results  Airway Mallampati: II  TM Distance: >3 FB Neck ROM: Full    Dental no notable dental hx.    Pulmonary neg pulmonary ROS, former smoker,    Pulmonary exam normal breath sounds clear to auscultation       Cardiovascular negative cardio ROS Normal cardiovascular exam Rhythm:Regular Rate:Normal     Neuro/Psych negative neurological ROS  negative psych ROS   GI/Hepatic negative GI ROS, Neg liver ROS,   Endo/Other  Morbid obesity  Renal/GU negative Renal ROS  negative genitourinary   Musculoskeletal negative musculoskeletal ROS (+)   Abdominal (+) + obese,   Peds negative pediatric ROS (+)  Hematology  (+) anemia ,   Anesthesia Other Findings   Reproductive/Obstetrics negative OB ROS                             Anesthesia Physical  Anesthesia Plan  ASA: III  Anesthesia Plan: Spinal   Post-op Pain Management:    Induction:   PONV Risk Score and Plan: Ondansetron, Dexamethasone, Treatment may vary due to age or medical condition and Scopolamine patch - Pre-op  Airway Management Planned: Natural Airway and Nasal Cannula  Additional Equipment:   Intra-op Plan:   Post-operative Plan:   Informed Consent: I have reviewed the patients History and Physical, chart, labs and discussed the procedure including the risks, benefits and alternatives for the proposed anesthesia with the patient or authorized representative who has indicated his/her understanding and acceptance.   Dental advisory given  Plan Discussed with: CRNA, Anesthesiologist and Surgeon  Anesthesia Plan Comments:         Anesthesia Quick Evaluation

## 2018-06-23 ENCOUNTER — Encounter (HOSPITAL_COMMUNITY)
Admission: RE | Disposition: A | Discharge status: Home / Self Care | Payer: Self-pay | Source: Home / Self Care | Attending: Obstetrics and Gynecology

## 2018-06-23 ENCOUNTER — Encounter (HOSPITAL_COMMUNITY): Payer: Self-pay | Admitting: *Deleted

## 2018-06-23 ENCOUNTER — Inpatient Hospital Stay (HOSPITAL_COMMUNITY): Payer: Medicare Other | Admitting: Anesthesiology

## 2018-06-23 ENCOUNTER — Inpatient Hospital Stay (HOSPITAL_COMMUNITY)
Admission: RE | Admit: 2018-06-23 | Discharge: 2018-06-26 | DRG: 787 | Disposition: A | Discharge status: Home / Self Care | Payer: Medicare Other | Attending: Obstetrics and Gynecology | Admitting: Obstetrics and Gynecology

## 2018-06-23 DIAGNOSIS — Z87891 Personal history of nicotine dependence: Secondary | ICD-10-CM | POA: Diagnosis not present

## 2018-06-23 DIAGNOSIS — Z3A39 39 weeks gestation of pregnancy: Secondary | ICD-10-CM | POA: Diagnosis not present

## 2018-06-23 DIAGNOSIS — O9081 Anemia of the puerperium: Secondary | ICD-10-CM | POA: Diagnosis not present

## 2018-06-23 DIAGNOSIS — O34211 Maternal care for low transverse scar from previous cesarean delivery: Secondary | ICD-10-CM

## 2018-06-23 DIAGNOSIS — D62 Acute posthemorrhagic anemia: Secondary | ICD-10-CM | POA: Diagnosis not present

## 2018-06-23 DIAGNOSIS — O99214 Obesity complicating childbirth: Secondary | ICD-10-CM | POA: Diagnosis present

## 2018-06-23 DIAGNOSIS — Z98891 History of uterine scar from previous surgery: Secondary | ICD-10-CM

## 2018-06-23 LAB — CBC
HEMATOCRIT: 33.5 % — AB (ref 36.0–46.0)
HEMOGLOBIN: 10.9 g/dL — AB (ref 12.0–15.0)
MCH: 23.5 pg — AB (ref 26.0–34.0)
MCHC: 32.5 g/dL (ref 30.0–36.0)
MCV: 72.2 fL — AB (ref 78.0–100.0)
Platelets: 232 10*3/uL (ref 150–400)
RBC: 4.64 MIL/uL (ref 3.87–5.11)
RDW: 16.1 % — AB (ref 11.5–15.5)
WBC: 11.2 10*3/uL — ABNORMAL HIGH (ref 4.0–10.5)

## 2018-06-23 LAB — RAPID URINE DRUG SCREEN, HOSP PERFORMED
AMPHETAMINES: NOT DETECTED
BENZODIAZEPINES: NOT DETECTED
Barbiturates: NOT DETECTED
Cocaine: NOT DETECTED
OPIATES: NOT DETECTED
TETRAHYDROCANNABINOL: NOT DETECTED

## 2018-06-23 LAB — CREATININE, SERUM
Creatinine, Ser: 0.4 mg/dL — ABNORMAL LOW (ref 0.44–1.00)
GFR calc Af Amer: >60 mL/min (ref >60)
GFR calc non Af Amer: >60 mL/min (ref >60)

## 2018-06-23 LAB — RPR: RPR: NONREACTIVE

## 2018-06-23 SURGERY — Surgical Case
Anesthesia: Spinal | Site: Abdomen | Wound class: Clean Contaminated

## 2018-06-23 MED ORDER — OXYTOCIN 10 UNIT/ML IJ SOLN
INTRAVENOUS | Status: DC | PRN
  Administered 2018-06-23: 40 [IU] via INTRAVENOUS

## 2018-06-23 MED ORDER — PHENYLEPHRINE 8 MG IN D5W 100 ML (0.08MG/ML) PREMIX OPTIME
INJECTION | INTRAVENOUS | Status: DC | PRN
  Administered 2018-06-23: 20 ug/min via INTRAVENOUS

## 2018-06-23 MED ORDER — METHYLERGONOVINE MALEATE 0.2 MG/ML IJ SOLN
INTRAMUSCULAR | Status: DC | PRN
  Administered 2018-06-23: 0.2 mg via INTRAMUSCULAR

## 2018-06-23 MED ORDER — KETAMINE HCL 10 MG/ML IJ SOLN
INTRAMUSCULAR | Status: DC | PRN
  Administered 2018-06-23 (×3): 10 mg via INTRAVENOUS

## 2018-06-23 MED ORDER — OXYCODONE HCL 5 MG PO TABS
5.0000 mg | ORAL_TABLET | ORAL | Status: DC | PRN
  Administered 2018-06-24 – 2018-06-26 (×7): 5 mg via ORAL
  Filled 2018-06-23 (×6): qty 1

## 2018-06-23 MED ORDER — FENTANYL CITRATE (PF) 100 MCG/2ML IJ SOLN
INTRAMUSCULAR | Status: AC
  Filled 2018-06-23: qty 2

## 2018-06-23 MED ORDER — NALBUPHINE HCL 10 MG/ML IJ SOLN: 5.0000 mg | Freq: Once | INTRAMUSCULAR | Status: DC | PRN

## 2018-06-23 MED ORDER — NALBUPHINE HCL 10 MG/ML IJ SOLN: 5.0000 mg | INTRAMUSCULAR | Status: DC | PRN

## 2018-06-23 MED ORDER — OXYCODONE HCL 5 MG PO TABS
10.0000 mg | ORAL_TABLET | ORAL | Status: DC | PRN
  Filled 2018-06-23: qty 2

## 2018-06-23 MED ORDER — FENTANYL CITRATE (PF) 100 MCG/2ML IJ SOLN
INTRAMUSCULAR | Status: DC | PRN
  Administered 2018-06-23 (×2): 25 ug via INTRAVENOUS
  Administered 2018-06-23: 100 ug via INTRAVENOUS
  Administered 2018-06-23: 15 ug via INTRAVENOUS
  Administered 2018-06-23: 25 ug via INTRAVENOUS

## 2018-06-23 MED ORDER — MEPERIDINE HCL 25 MG/ML IJ SOLN: 6.2500 mg | INTRAMUSCULAR | Status: DC | PRN

## 2018-06-23 MED ORDER — SODIUM CHLORIDE 0.9% FLUSH: 3.0000 mL | INTRAVENOUS | Status: DC | PRN

## 2018-06-23 MED ORDER — ENOXAPARIN SODIUM 60 MG/0.6ML ~~LOC~~ SOLN
60.0000 mg | SUBCUTANEOUS | Status: DC
  Administered 2018-06-24 – 2018-06-26 (×3): 60 mg via SUBCUTANEOUS
  Filled 2018-06-23 (×3): qty 0.6

## 2018-06-23 MED ORDER — KETOROLAC TROMETHAMINE 30 MG/ML IJ SOLN: 30.0000 mg | Freq: Four times a day (QID) | INTRAMUSCULAR | Status: AC | PRN

## 2018-06-23 MED ORDER — LACTATED RINGERS IV SOLN: INTRAVENOUS | Status: DC

## 2018-06-23 MED ORDER — OXYCODONE HCL 5 MG/5ML PO SOLN: 5.0000 mg | Freq: Once | ORAL | Status: DC | PRN

## 2018-06-23 MED ORDER — SIMETHICONE 80 MG PO CHEW
80.0000 mg | CHEWABLE_TABLET | Freq: Three times a day (TID) | ORAL | Status: DC
  Administered 2018-06-23 – 2018-06-26 (×8): 80 mg via ORAL
  Filled 2018-06-23 (×8): qty 1

## 2018-06-23 MED ORDER — ONDANSETRON HCL 4 MG/2ML IJ SOLN
INTRAMUSCULAR | Status: AC
Start: 2018-06-23 — End: ?
  Filled 2018-06-23: qty 2

## 2018-06-23 MED ORDER — METOCLOPRAMIDE HCL 5 MG/ML IJ SOLN
INTRAMUSCULAR | Status: DC | PRN
  Administered 2018-06-23: 10 mg via INTRAVENOUS

## 2018-06-23 MED ORDER — ACETAMINOPHEN 325 MG PO TABS
650.0000 mg | ORAL_TABLET | ORAL | Status: DC | PRN
  Administered 2018-06-24 (×2): 650 mg via ORAL
  Filled 2018-06-23 (×2): qty 2

## 2018-06-23 MED ORDER — PHENYLEPHRINE HCL 10 MG/ML IJ SOLN
INTRAMUSCULAR | Status: DC | PRN
  Administered 2018-06-23 (×2): 80 ug via INTRAVENOUS

## 2018-06-23 MED ORDER — TRANEXAMIC ACID 1000 MG/10ML IV SOLN
1000.0000 mg | INTRAVENOUS | Status: AC
  Administered 2018-06-23: 1000 mg via INTRAVENOUS
  Filled 2018-06-23: qty 1000

## 2018-06-23 MED ORDER — LACTATED RINGERS IV SOLN
INTRAVENOUS | Status: DC
  Administered 2018-06-23 (×3): via INTRAVENOUS

## 2018-06-23 MED ORDER — ZOLPIDEM TARTRATE 5 MG PO TABS: 5.0000 mg | ORAL_TABLET | Freq: Every evening | ORAL | Status: DC | PRN

## 2018-06-23 MED ORDER — MORPHINE SULFATE (PF) 0.5 MG/ML IJ SOLN
INTRAMUSCULAR | Status: AC
Start: 2018-06-23 — End: ?
  Filled 2018-06-23: qty 10

## 2018-06-23 MED ORDER — ONDANSETRON HCL 4 MG/2ML IJ SOLN: 4.0000 mg | Freq: Three times a day (TID) | INTRAMUSCULAR | Status: DC | PRN

## 2018-06-23 MED ORDER — METHYLERGONOVINE MALEATE 0.2 MG/ML IJ SOLN
INTRAMUSCULAR | Status: AC
  Filled 2018-06-23: qty 1

## 2018-06-23 MED ORDER — ONDANSETRON HCL 4 MG/2ML IJ SOLN: 4.0000 mg | Freq: Once | INTRAMUSCULAR | Status: DC | PRN

## 2018-06-23 MED ORDER — OXYCODONE HCL 5 MG PO TABS: 5.0000 mg | ORAL_TABLET | Freq: Once | ORAL | Status: DC | PRN

## 2018-06-23 MED ORDER — BUPIVACAINE IN DEXTROSE 0.75-8.25 % IT SOLN
INTRATHECAL | Status: DC | PRN
  Administered 2018-06-23: 1.6 mL via INTRATHECAL

## 2018-06-23 MED ORDER — DEXAMETHASONE SODIUM PHOSPHATE 10 MG/ML IJ SOLN
INTRAMUSCULAR | Status: AC
  Filled 2018-06-23: qty 1

## 2018-06-23 MED ORDER — SCOPOLAMINE 1 MG/3DAYS TD PT72
1.0000 | MEDICATED_PATCH | Freq: Once | TRANSDERMAL | Status: DC
  Filled 2018-06-23: qty 1

## 2018-06-23 MED ORDER — MORPHINE SULFATE (PF) 0.5 MG/ML IJ SOLN
INTRAMUSCULAR | Status: DC | PRN
  Administered 2018-06-23: 1 mg via INTRAVENOUS
  Administered 2018-06-23: .15 mg via EPIDURAL

## 2018-06-23 MED ORDER — OXYTOCIN 40 UNITS IN LACTATED RINGERS INFUSION - SIMPLE MED: 2.5000 [IU]/h | INTRAVENOUS | Status: AC

## 2018-06-23 MED ORDER — NALOXONE HCL 4 MG/10ML IJ SOLN
1.0000 ug/kg/h | INTRAVENOUS | Status: DC | PRN
  Filled 2018-06-23: qty 5

## 2018-06-23 MED ORDER — SIMETHICONE 80 MG PO CHEW
80.0000 mg | CHEWABLE_TABLET | ORAL | Status: DC
  Administered 2018-06-24 – 2018-06-25 (×3): 80 mg via ORAL
  Filled 2018-06-23 (×3): qty 1

## 2018-06-23 MED ORDER — TETANUS-DIPHTH-ACELL PERTUSSIS 5-2.5-18.5 LF-MCG/0.5 IM SUSP: 0.5000 mL | Freq: Once | INTRAMUSCULAR | Status: DC

## 2018-06-23 MED ORDER — DIPHENHYDRAMINE HCL 25 MG PO CAPS: 25.0000 mg | ORAL_CAPSULE | Freq: Four times a day (QID) | ORAL | Status: DC | PRN

## 2018-06-23 MED ORDER — DIBUCAINE 1 % RE OINT: 1.0000 "application " | TOPICAL_OINTMENT | RECTAL | Status: DC | PRN

## 2018-06-23 MED ORDER — FENTANYL CITRATE (PF) 100 MCG/2ML IJ SOLN: 25.0000 ug | INTRAMUSCULAR | Status: DC | PRN

## 2018-06-23 MED ORDER — LACTATED RINGERS IV SOLN
INTRAVENOUS | Status: DC | PRN
  Administered 2018-06-23: 11:00:00 via INTRAVENOUS

## 2018-06-23 MED ORDER — IBUPROFEN 600 MG PO TABS
600.0000 mg | ORAL_TABLET | Freq: Four times a day (QID) | ORAL | Status: DC
  Administered 2018-06-23 – 2018-06-26 (×11): 600 mg via ORAL
  Filled 2018-06-23 (×11): qty 1

## 2018-06-23 MED ORDER — SIMETHICONE 80 MG PO CHEW: 80.0000 mg | CHEWABLE_TABLET | ORAL | Status: DC | PRN

## 2018-06-23 MED ORDER — PROPOFOL 10 MG/ML IV BOLUS
INTRAVENOUS | Status: DC | PRN
  Administered 2018-06-23: 10 mg via INTRAVENOUS
  Administered 2018-06-23: 20 mg via INTRAVENOUS
  Administered 2018-06-23: 10 mg via INTRAVENOUS
  Administered 2018-06-23: 20 mg via INTRAVENOUS
  Administered 2018-06-23 (×5): 10 mg via INTRAVENOUS

## 2018-06-23 MED ORDER — NALOXONE HCL 0.4 MG/ML IJ SOLN: 0.4000 mg | INTRAMUSCULAR | Status: DC | PRN

## 2018-06-23 MED ORDER — MIDAZOLAM HCL 2 MG/2ML IJ SOLN
INTRAMUSCULAR | Status: AC
  Filled 2018-06-23: qty 2

## 2018-06-23 MED ORDER — DIPHENHYDRAMINE HCL 50 MG/ML IJ SOLN: 12.5000 mg | INTRAMUSCULAR | Status: DC | PRN

## 2018-06-23 MED ORDER — ACETAMINOPHEN 160 MG/5ML PO SOLN: 325.0000 mg | ORAL | Status: DC | PRN

## 2018-06-23 MED ORDER — WITCH HAZEL-GLYCERIN EX PADS: 1.0000 "application " | MEDICATED_PAD | CUTANEOUS | Status: DC | PRN

## 2018-06-23 MED ORDER — CHLOROPROCAINE HCL (PF) 3 % IJ SOLN
INTRAMUSCULAR | Status: AC
  Filled 2018-06-23: qty 20

## 2018-06-23 MED ORDER — MENTHOL 3 MG MT LOZG: 1.0000 | LOZENGE | OROMUCOSAL | Status: DC | PRN

## 2018-06-23 MED ORDER — ENOXAPARIN SODIUM 40 MG/0.4ML ~~LOC~~ SOLN: 40.0000 mg | SUBCUTANEOUS | Status: DC

## 2018-06-23 MED ORDER — ACETAMINOPHEN 325 MG PO TABS: 325.0000 mg | ORAL_TABLET | ORAL | Status: DC | PRN

## 2018-06-23 MED ORDER — COCONUT OIL OIL
1.0000 "application " | TOPICAL_OIL | Status: DC | PRN
  Administered 2018-06-24: 1 via TOPICAL
  Filled 2018-06-23: qty 120

## 2018-06-23 MED ORDER — PRENATAL MULTIVITAMIN CH
1.0000 | ORAL_TABLET | Freq: Every day | ORAL | Status: DC
  Administered 2018-06-24 – 2018-06-25 (×2): 1 via ORAL
  Filled 2018-06-23 (×2): qty 1

## 2018-06-23 MED ORDER — MIDAZOLAM HCL 2 MG/2ML IJ SOLN
INTRAMUSCULAR | Status: DC | PRN
  Administered 2018-06-23 (×2): 1 mg via INTRAVENOUS

## 2018-06-23 MED ORDER — METOCLOPRAMIDE HCL 5 MG/ML IJ SOLN
INTRAMUSCULAR | Status: AC
  Filled 2018-06-23: qty 2

## 2018-06-23 MED ORDER — PHENYLEPHRINE 40 MCG/ML (10ML) SYRINGE FOR IV PUSH (FOR BLOOD PRESSURE SUPPORT)
PREFILLED_SYRINGE | INTRAVENOUS | Status: AC
Start: 2018-06-23 — End: ?
  Filled 2018-06-23: qty 10

## 2018-06-23 MED ORDER — DEXAMETHASONE SODIUM PHOSPHATE 10 MG/ML IJ SOLN
INTRAMUSCULAR | Status: DC | PRN
  Administered 2018-06-23: 10 mg via INTRAVENOUS

## 2018-06-23 MED ORDER — KETAMINE HCL 10 MG/ML IJ SOLN
INTRAMUSCULAR | Status: AC
  Filled 2018-06-23: qty 1

## 2018-06-23 MED ORDER — DEXTROSE 5 % IV SOLN
3.0000 g | INTRAVENOUS | Status: AC
  Administered 2018-06-23: 3 g via INTRAVENOUS
  Filled 2018-06-23: qty 3

## 2018-06-23 MED ORDER — SENNOSIDES-DOCUSATE SODIUM 8.6-50 MG PO TABS
2.0000 | ORAL_TABLET | ORAL | Status: DC
  Administered 2018-06-24 – 2018-06-25 (×3): 2 via ORAL
  Filled 2018-06-23 (×3): qty 2

## 2018-06-23 MED ORDER — NALBUPHINE HCL 10 MG/ML IJ SOLN
5.0000 mg | INTRAMUSCULAR | Status: DC | PRN
Start: 2018-06-23 — End: 2018-06-26

## 2018-06-23 MED ORDER — SCOPOLAMINE 1 MG/3DAYS TD PT72
MEDICATED_PATCH | TRANSDERMAL | Status: DC | PRN
  Administered 2018-06-23: 1.5 mg via TRANSDERMAL

## 2018-06-23 MED ORDER — OXYTOCIN 10 UNIT/ML IJ SOLN
INTRAMUSCULAR | Status: AC
  Filled 2018-06-23: qty 4

## 2018-06-23 MED ORDER — DIPHENHYDRAMINE HCL 25 MG PO CAPS: 25.0000 mg | ORAL_CAPSULE | ORAL | Status: DC | PRN

## 2018-06-23 MED ORDER — ONDANSETRON HCL 4 MG/2ML IJ SOLN
INTRAMUSCULAR | Status: DC | PRN
  Administered 2018-06-23: 4 mg via INTRAVENOUS

## 2018-06-23 SURGICAL SUPPLY — 44 items
BENZOIN TINCTURE PRP APPL 2/3 (GAUZE/BANDAGES/DRESSINGS) ×3 IMPLANT
CHLORAPREP W/TINT 26ML (MISCELLANEOUS) ×3 IMPLANT
CLAMP CORD UMBIL (MISCELLANEOUS) IMPLANT
CLOSURE STERI STRIP 1/2 X4 (GAUZE/BANDAGES/DRESSINGS) ×3 IMPLANT
CLOTH BEACON ORANGE TIMEOUT ST (SAFETY) ×3 IMPLANT
DRSG OPSITE POSTOP 4X10 (GAUZE/BANDAGES/DRESSINGS) ×3 IMPLANT
ELECT REM PT RETURN 9FT ADLT (ELECTROSURGICAL) ×3
ELECTRODE REM PT RTRN 9FT ADLT (ELECTROSURGICAL) ×1 IMPLANT
EXTRACTOR VACUUM M CUP 4 TUBE (SUCTIONS) IMPLANT
EXTRACTOR VACUUM M CUP 4' TUBE (SUCTIONS)
GAUZE SPONGE 4X4 12PLY STRL LF (GAUZE/BANDAGES/DRESSINGS) ×6 IMPLANT
GLOVE BIOGEL PI IND STRL 7.0 (GLOVE) ×2 IMPLANT
GLOVE BIOGEL PI IND STRL 7.5 (GLOVE) ×2 IMPLANT
GLOVE BIOGEL PI INDICATOR 7.0 (GLOVE) ×4
GLOVE BIOGEL PI INDICATOR 7.5 (GLOVE) ×4
GLOVE ECLIPSE 7.5 STRL STRAW (GLOVE) ×3 IMPLANT
GOWN STRL REUS W/TWL LRG LVL3 (GOWN DISPOSABLE) ×9 IMPLANT
HEMOSTAT SURGICEL 4X8 (HEMOSTASIS) ×3 IMPLANT
HOVERMATT SINGLE USE (MISCELLANEOUS) ×3 IMPLANT
KIT ABG SYR 3ML LUER SLIP (SYRINGE) IMPLANT
NEEDLE HYPO 25X5/8 SAFETYGLIDE (NEEDLE) IMPLANT
NS IRRIG 1000ML POUR BTL (IV SOLUTION) ×3 IMPLANT
PACK C SECTION WH (CUSTOM PROCEDURE TRAY) ×3 IMPLANT
PAD ABD 7.5X8 STRL (GAUZE/BANDAGES/DRESSINGS) ×3 IMPLANT
PAD OB MATERNITY 4.3X12.25 (PERSONAL CARE ITEMS) ×3 IMPLANT
PENCIL SMOKE EVAC W/HOLSTER (ELECTROSURGICAL) ×3 IMPLANT
RETAINER VISCERAL (MISCELLANEOUS) ×3 IMPLANT
RETRACTOR TRAXI PANNICULUS (MISCELLANEOUS) ×1 IMPLANT
RTRCTR C-SECT PINK 25CM LRG (MISCELLANEOUS) ×3 IMPLANT
SPONGE LAP 18X18 X RAY DECT (DISPOSABLE) ×9 IMPLANT
STRIP CLOSURE SKIN 1/2X4 (GAUZE/BANDAGES/DRESSINGS) ×2 IMPLANT
SUT PDS AB 0 CTX 60 (SUTURE) ×3 IMPLANT
SUT PLAIN 2 0 XLH (SUTURE) ×3 IMPLANT
SUT VIC AB 0 CT1 36 (SUTURE) ×3 IMPLANT
SUT VIC AB 0 CTX 36 (SUTURE) ×4
SUT VIC AB 0 CTX36XBRD ANBCTRL (SUTURE) ×2 IMPLANT
SUT VIC AB 2-0 CT1 27 (SUTURE) ×2
SUT VIC AB 2-0 CT1 TAPERPNT 27 (SUTURE) ×1 IMPLANT
SUT VIC AB 2-0 SH 27 (SUTURE) ×2
SUT VIC AB 2-0 SH 27XBRD (SUTURE) ×1 IMPLANT
SUT VIC AB 4-0 KS 27 (SUTURE) ×3 IMPLANT
TOWEL OR 17X24 6PK STRL BLUE (TOWEL DISPOSABLE) ×3 IMPLANT
TRAXI PANNICULUS RETRACTOR (MISCELLANEOUS) ×2
TRAY FOLEY W/BAG SLVR 14FR LF (SET/KITS/TRAYS/PACK) ×3 IMPLANT

## 2018-06-23 NOTE — Anesthesia Postprocedure Evaluation (Signed)
Anesthesia Post Note  Patient: Lebron ConnersJessica A Mcelhinny  Procedure(s) Performed: REPEAT CESAREAN SECTION (N/A Abdomen)     Patient location during evaluation: PACU Anesthesia Type: Spinal Level of consciousness: oriented and awake and alert Pain management: pain level controlled Vital Signs Assessment: post-procedure vital signs reviewed and stable Respiratory status: spontaneous breathing, respiratory function stable and patient connected to nasal cannula oxygen Cardiovascular status: blood pressure returned to baseline and stable Postop Assessment: no headache, no backache and no apparent nausea or vomiting Anesthetic complications: no    Last Vitals:  Vitals:   06/23/18 1829 06/23/18 2045  BP:  (!) 107/57  Pulse:  71  Resp:  18  Temp:  37 C  SpO2: 94% 95%    Last Pain:  Vitals:   06/23/18 2045  TempSrc: Oral  PainSc:                  Rosaria Kubin

## 2018-06-23 NOTE — Anesthesia Procedure Notes (Addendum)
Spinal  Patient location during procedure: OR Start time: 06/23/2018 10:05 AM End time: 06/23/2018 10:15 AM Staffing Anesthesiologist: Bethena Midgetddono, Ernest, MD Performed: other anesthesia staff  Preanesthetic Checklist Completed: patient identified, site marked, surgical consent, pre-op evaluation, timeout performed, IV checked, risks and benefits discussed and monitors and equipment checked Spinal Block Patient position: sitting Prep: DuraPrep Patient monitoring: continuous pulse ox, blood pressure and heart rate Approach: midline Location: L4-5 Injection technique: single-shot Needle Needle type: Pencan  Needle gauge: 24 G Needle length: 10 cm Assessment Sensory level: T4 Events: injection painful

## 2018-06-23 NOTE — H&P (Addendum)
LABOR AND DELIVERY ADMISSION HISTORY AND PHYSICAL NOTE  Shawna Hunt is a 32 y.o. female [phone] with IUP at 5132w0d by 13 wk u/s presenting for scheduled repeat cesarean section.   She reports positive fetal movement. She denies leakage of fluid or vaginal bleeding.  Prenatal History/Complications:  Past Medical History: Past Medical History:  Diagnosis Date  . Anemia   . Anxiety   . Depression    was taking a medicine a year ago doesn't remember the name  . Medical history non-contributory   . Ovarian cyst     Past Surgical History: Past Surgical History:  Procedure Laterality Date  . CESAREAN SECTION     two  . CESAREAN SECTION N/A 07/02/2016   Procedure: CESAREAN SECTION;  Surgeon: Willodean Rosenthalarolyn Harraway-Smith, MD;  [facility];  Service: Obstetrics;  Laterality: N/A;  . HERNIA REPAIR      Obstetrical History: OB History    Gravida  4   Para  4   Term  4   Preterm  0   AB  0   Living  4     SAB  0   TAB  0   Ectopic  0   Multiple  0   Live Births  4           Social History: Social History   Socioeconomic History  . Marital status: Single    Spouse name: Daythan  . Number of children: 3  . Years of education: 4111  . Highest education level: 11th grade  Occupational History  . Not on file  Social Needs  . Financial resource strain: Hard  . Food insecurity:    Worry: Never true    Inability: Never true  . Transportation needs:    Medical: No    Non-medical: No  Tobacco Use  . Smoking status: Former Smoker    Last attempt to quit: 12/19/2013    Years since quitting: 4.5  . Smokeless tobacco: Never Used  Substance and Sexual Activity  . Alcohol use: Yes  . Drug use: Yes    Frequency: 3.0 times per week    Types: Marijuana    Comment: positive drug screen July 2019  . Sexual activity: Yes    Birth control/protection: None, Condom  Lifestyle  . Physical activity:    Days per week: 6 days    Minutes per session: 60  min  . Stress: Not at all  Relationships  . Social connections:    Talks on phone: More than three times a week    Gets together: Twice a week    Attends religious service: Never    Active member of club or organization: No    Attends meetings of clubs or organizations: Never    Relationship status: Patient refused  Other Topics Concern  . Not on file  Social History Narrative  . Not on file    Family History: Family History  Problem Relation Age of Onset  . Hypertension Mother   . Hypertension Father   . Hypertension Maternal Aunt   . Diabetes Maternal Aunt   . Hypertension Paternal Aunt   . Hearing loss Son     Allergies: No Known Allergies  Facility-Administered Medications Prior to Admission  Medication Dose Route Frequency Provider Last Rate Last Dose  . ceFAZolin (ANCEF) 2 g in dextrose 5 % 100 mL IVPB  2 g Intravenous 30 min Pre-Op [place] BingPickens, Charlie, MD      . sodium citrate-citric acid (  ORACIT) solution 30 mL  30 mL Oral 30 min Pre-Op [place] BingPickens, Charlie, MD       Medications Prior to Admission  Medication Sig Dispense Refill Last Dose  . Prenatal Vit-Fe Fumarate-FA (PRENATAL MULTIVITAMIN) TABS tablet Take 1 tablet by mouth daily at 12 noon.   06/09/2018 at Unknown time     Review of Systems   All systems reviewed and negative except as stated in HPI  Blood pressure 140/77, pulse 66, temperature 98.3 F (36.8 C), temperature source Oral, resp. rate 20, height 5\' 8"  (1.727 m), weight 120.8 kg, unknown if currently breastfeeding. General appearance: alert, cooperative and appears stated age Lungs: clear to auscultation bilaterally Heart: regular rate and rhythm Abdomen: soft, non-tender; bowel sounds normal, gravid Extremities: No calf swelling or tenderness Fetal monitoring: 143 hr     Prenatal labs: ABO, Rh: --/--/B POS (08/13 1020) Antibody: NEG (08/13 1020) Rubella: Immune (04/15 0000) RPR: Non Reactive (08/13 1020)  HBsAg: Negative (04/15 0000)   HIV: Non-reactive (04/15 0000)  GBS:   unk 1 hr Glucola: 150 > "normal" 3-hour per hd records Genetic screening:  Quad neg Anatomy US: wnl  Prenatal Transfer Tool  Maternal Diabetes: No Genetic Screening: Normal Maternal Ultrasounds/Referrals: Normal Fetal Ultrasounds or other Referrals:  None Maternal Substance Abuse:  THC, nicotine Significant Maternal Medications:  None Significant Maternal Lab Results: None  Results for orders placed or performed during the hospital encounter of 06/23/18 (from the past 24 hour(s))  Rapid urine drug screen (hospital performed)   Collection Time: 06/23/18 10:25 AM  Result Value Ref Range   Opiates NONE DETECTED NONE DETECTED   Cocaine NONE DETECTED NONE DETECTED   Benzodiazepines NONE DETECTED NONE DETECTED   Amphetamines NONE DETECTED NONE DETECTED   Tetrahydrocannabinol NONE DETECTED NONE DETECTED   Barbiturates NONE DETECTED NONE DETECTED    Patient Active Problem List   Diagnosis Date Noted  . History of cesarean delivery 04/08/2018  . Round ligament pain 02/12/2018  . Bacterial vaginitis 02/12/2018  . Cesarean delivery delivered 07/03/2016  . Group B Streptococcus carrier, +RV culture, currently pregnant 06/23/2016  . Iron deficiency anemia of pregnancy 04/15/2016  . Poor weight gain of pregnancy 03/28/2016  . Previous cesarean deliveryx 2, antepartum 12/27/2015  . Obesity in pregnancy, antepartum 12/27/2015  . Substance abuse (HCC) 11/26/2015  . Supervision of normal pregnancy, antepartum 11/17/2015    Assessment: Shawna Hunt is a 32 y.o. G4P3003 at 9443w0d here for repeat cesarean section. Hx LTCS x3, most recent 2017, op note cites no complications.  The risks of cesarean section were discussed with the patient including but were not limited to: bleeding which may require transfusion or reoperation; infection which may require antibiotics; injury to bowel, bladder, ureters or other surrounding organs; injury to the fetus;  need for additional procedures including hysterectomy in the event of a life-threatening hemorrhage; placental abnormalities wth subsequent pregnancies, incisional problems, thromboembolic phenomenon and other postoperative/anesthesia complications. Discussed increased risk of above in setting of 4th c/s. Preoperative prophylactic antibiotics and SCDs ordered on call to the OR.  To OR when ready.  # maternal substance use: UDS positive for THC and nicotine this pregnancy. Pt says now abstinent. Discussed implications for breastfeeding if chooses to resume. Will order sw consult # Anemia: h 9.5 noted #FWB: Normal fhts #ID:  gbs unk, intact, no indication for tx. 3 g ancef ordered (weight > 120 kg) #MOF: breast #MOC: iud #Circ:  n/a  Shawna Hunt 06/23/2018, 11:52 AM

## 2018-06-23 NOTE — Lactation Note (Addendum)
This note was copied from a baby's chart. Lactation Consultation Note  Patient Name: Shawna Hunt [id number]'WToday's Date: 06/23/2018 Reason for consult: Initial assessment;Term P4, 549 hrs old female infant  Per mom, plans BF longer she BF her son 3 to 4 months and stopped due low milk supply and supplemented w/ formula. [facility] [place] Mom demonstrated hand expression to [facility] and expressed colostrum from breast. Per mom, BF 3 times since delivery, and feed infant  at 6:30 pm for ( 30 minutes), LC did not observe feeding. Per mom, Infant had 1 wet diaper since delivery. LC notice mom has large breast w/ semi-short shaft nipples. LC taught mom how to do nipple roll and nipple everted and extended well and  mom can also, pre-pump before latching infant to breast. Mom will call LC if needed she feels confident w/ Breastfeeding. LC discussed I&O. Mom encouraged to feed baby w/feeding cues but not exceed going past 3 hours w/out feeding infant. Reviewed Baby & Me book's Breastfeeding Basics.  Mom made aware of O/P services, breastfeeding support groups, community resources, and our phone # for post-discharge questions.   Goals: 1. Mom will continue w/ STS. 2. Mom plans to feed baby w/ hunger feeding cues, 8 to 12 times w/ 24 hours including nights. 3. Mom will do nipple roll or pre-pump to help evert nipple out more to help w/ latch. Maternal Data Formula Feeding for Exclusion: No Has patient been taught Hand Expression?: Yes(LC taught hand expression) Does the patient have breastfeeding experience prior to this delivery?: Yes  Feeding Feeding Type: Breast Fed  LATCH Score                   Interventions    Lactation Tools Discussed/Used WIC Program: Yes Pump Review: Setup, frequency, and cleaning;Milk Storage Initiated by:: Shawna Hunt, IBCLC Date initiated:: 06/23/18   Consult Status      Shawna Hunt 06/23/2018, 8:45 PM

## 2018-06-23 NOTE — Anesthesia Postprocedure Evaluation (Signed)
Anesthesia Post Note  Patient: Shawna Hunt  Procedure(s) Performed: REPEAT CESAREAN SECTION (N/A Abdomen)     Patient location during evaluation: Mother Baby Anesthesia Type: Spinal Level of consciousness: awake Pain management: satisfactory to patient Vital Signs Assessment: post-procedure vital signs reviewed and stable Respiratory status: spontaneous breathing Cardiovascular status: stable Anesthetic complications: no    Last Vitals:  Vitals:   06/23/18 1547 06/23/18 1645  BP: 123/72 (!) 105/57  Pulse: (!) 56 60  Resp: 18 17  Temp:  (!) 36.4 C  SpO2: 95% 94%    Last Pain:  Vitals:   06/23/18 1645  TempSrc: Oral  PainSc: 5    Pain Goal:                 Key[employer]BURGER,Shawna Hunt

## 2018-06-23 NOTE — Transfer of Care (Signed)
Immediate Anesthesia Transfer of Care Note  Patient: Shawna ConnersJessica A Hunt  Procedure(s) Performed: REPEAT CESAREAN SECTION (N/A Abdomen)  Patient Location: PACU  Anesthesia Type:Spinal  Level of Consciousness: awake, alert  and oriented  Airway & Oxygen Therapy: Patient Spontanous Breathing  Post-op Assessment: Report given to RN and Post -op Vital signs reviewed and stable  Post vital signs: Reviewed and stable  Last Vitals:  Vitals Value Taken Time  BP    Temp    Pulse    Resp    SpO2      Last Pain:  Vitals:   06/23/18 0858  TempSrc: Oral         Complications: No apparent anesthesia complications

## 2018-06-23 NOTE — Op Note (Signed)
Cesarean Section Operative Report  Shawna Hunt  06/23/2018  Indications: Scheduled Proceedure/Maternal Request   Pre-operative Diagnosis: repeat low transverse cesarean section  Post-operative Diagnosis: Same   Surgeon: Surgeon(s) and Role:    * Mekiah Cambridge, Ailene Rud, MD - Primary    * Sloan Leiter, MD - Assisting   Anesthesia: spinal    Estimated Blood Loss: 945 ml  Total IV Fluids: 2800 ml LR  Urine Output:: 150 ml clear urine  Specimens: none  Findings: Viable female infant in cephalic presentation; Apgars pending; weight pending g; arterial cord pH not obtained; clear amniotic fluid; intact placenta with three vessel cord; normal uterus, fallopian tubes and ovaries bilaterally.Significant scar tissue anterior to the rectus. No significant intraperitoneal adhesive disease.  Baby condition / location:  Couplet care / Skin to Skin   Complications: no complications  Indications: Shawna Hunt is a 32 y.o. [phone] with an IUP 72w0dpresenting for repeat ltcs.  The risks, benefits, complications, treatment options, and exected outcomes were discussed with the patient . The patient dwith the proposed plan, giving informed consent. identified as Shawna Corneaand the procedure verified as C-Section Delivery.  Procedure Details:  The patient was taken back to the operative suite where spinal anesthesia was placed.  A time out was held and the above information confirmed.   After induction of anesthesia, the patient was draped and prepped in the usual sterile manner and placed in a dorsal supine position with a leftward tilt. A Pfannenstiel incision was made and carried down through the subcutaneous tissue to the fascia. Fascial incision was made and sharply extended transversely. The fascia was separated from the underlying rectus tissue superiorly. The right rectus was partially transected w/ bovie cautery. The peritoneum was entered as fascia was dissected superiorly.   Alexis retractor was placed. Bladder flap created and bladder blade placed. A low transverse uterine incision was made and extended bluntly. Delivered from cephalic presentation was a viable infant with Apgars and weight as above.  After waiting 60 seconds for delayed cord cutting, the umbilical cord was clamped and cut cord blood was obtained for evaluation. Cord ph was not sent. The placenta was removed Intact and appeared normal. The uterine outline, tubes and ovaries appeared normal. The uterine incision was closed with running locked sutures of 0Vicryl with an imbricating layer of the same.   Hemostasis was observed. Irrigation performed. Patient complained of some pain; 10 ml of 3 % nubaine was poured over the hysterotomy. Upon attempting to close the peritoneum bleeding was identified bilaterally where rectus met fascia. On the right this was treated with suture ligation x1 with 2-0 vicryl. Methergine x1 and tranexamic acid x1 were both ordered. On the left this was treated with surgiceal after bovie cautery. A significant amount of bowel was entountered anterior to the uterus. Care was taken using Fish and malleable. The rectus muscles were examined and hemostasis observed. The fascia was then reapproximated with running sutures of 0 pds. The subcuticular closure was performed using 2-0 plain gut. The skin was closed with 4-0Vicryl.   Instrument, sponge, and needle counts were correct prior the abdominal closure and were correct at the conclusion of the case.     Disposition: PACU - hemodynamically stable.   Maternal Condition: stable       Signed: NEnnis Forts8/14/2019 11:56 AM

## 2018-06-24 LAB — CBC
HCT: 25.4 % — ABNORMAL LOW (ref 36.0–46.0)
Hemoglobin: 8.8 g/dL — ABNORMAL LOW (ref 12.0–15.0)
MCH: 25 pg — AB (ref 26.0–34.0)
MCHC: 34.6 g/dL (ref 30.0–36.0)
MCV: 72.2 fL — ABNORMAL LOW (ref 78.0–100.0)
PLATELETS: 196 10*3/uL (ref 150–400)
RBC: 3.52 MIL/uL — AB (ref 3.87–5.11)
RDW: 16 % — AB (ref 11.5–15.5)
WBC: 10.5 10*3/uL (ref 4.0–10.5)

## 2018-06-24 LAB — BIRTH TISSUE RECOVERY COLLECTION (PLACENTA DONATION)

## 2018-06-24 NOTE — Progress Notes (Signed)
Post Op Day 2  Subjective: no complaints, up ad lib, voiding and + flatus  Objective: Blood pressure (!) 101/58, pulse (!) 56, temperature 98.2 F (36.8 C), temperature source Oral, resp. rate 18, height 5\' 8"  (1.727 m), weight 120.8 kg, SpO2 98 %, unknown if currently breastfeeding.  Physical Exam:  General: alert, cooperative and appears stated age 63Lochia: appropriate Uterine Fundus: firm Incision: healing well, no significant drainage, no dehiscence, no significant erythema DVT Evaluation: No evidence of DVT seen on physical exam.  Recent Labs    06/23/18 1510 06/24/18 0546  HGB 10.9* 8.8*  HCT 33.5* 25.4*    Assessment/Plan: Plan for discharge tomorrow, Breastfeeding and Contraception considering Paraguard   LOS: 1 day   Candis Schatzatricia Paz Winsett 06/24/2018, 8:26 AM

## 2018-06-24 NOTE — Clinical Social Work Maternal (Signed)
CLINICAL SOCIAL WORK MATERNAL/CHILD NOTE  Patient Details  Name: Shawna Hunt MRN: [id number] Date of Birth: [birth year]  Date:  06/24/2018  Clinical Social Worker Initiating Note:  Terri Piedra, [place] Date/Time: Initiated:  06/24/18/1200     Child's Name:  Shawna Hunt   Biological Parents:  Mother, Father(Shawna Hunt and Shawna Hunt)   Need for Interpreter:  None   Reason for Referral:  Current Substance Use/Substance Use During Pregnancy    Address:  [place] [address]    Phone number:  [phone] (home) [phone] (work)    Additional phone number:   Household Members/Support Persons (HM/SP):   Household Member/Support Person 1, Household Member/Support Person 2, Household Member/Support Person 3   HM/SP Name Relationship DOB or Age  HM/SP -1 Shawna Hunt daughter 09/12/06  HM/SP -2 Shawna Hunt son 06/12/08  HM/SP -3 Shawna Hunt son 07/02/16  HM/SP -4        HM/SP -5        HM/SP -6        HM/SP -7        HM/SP -8          Natural Supports (not living in the home):  Immediate Family(FOB)   Professional Supports: None   Employment:     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    Strengths:  Other (Comment), Ability to meet basic needs , Home prepared for child , Pediatrician chosen(Open to counseling resources)   Psychotropic Medications:         Pediatrician:    [place] area  Pediatrician List:   [facility] ([address])  [place]      Pediatrician Fax Number:    Risk Factors/Current Problems:  Substance Use , Mental Health Concerns    Cognitive State:  Able to Concentrate , Alert , Linear Thinking     Mood/Affect:  Calm , Interested    CSW Assessment: CSW met with [name] in her first floor room/148 to offer support and complete assessment for marijuana use.  Upon chart review, CSW also notes a hx of Anxiety and Depression with Rx for Xanax in 2014.  [name]'s UDS is negative on admission.  Baby's UDS is negative. [name] was quiet, but pleasant and welcoming of CSW's visit.  She states that she is in more pain than she recalls being in after her other 3 c-sections.  She states baby is doing well, but hasn't eaten much today because she has been so sleepy.  She reports that this baby was a surprise, but that her father is the same as her 42 year old son's and that they have a positive relationship, though she does not consider them in a relationship at this time.  She states they are contemplating getting an apartment together so that they will be there for their kids, but feels this is a "sacrifice."  She reports they are currently "co-parenting" and that he is involved and supportive.   CSW discussed hospital drug screen policy and [name] was open about her marijuana use.  She reports that this was her most difficult pregnancy and that she was  sick for the majority of it.  She states "I know I'm not supposed to smoke pot."  She added, "it's been some months," when CSW asked when she thinks her last use was.  She states her use was strictly for nausea and vomiting and she does not think she has an addiction problem, nor is she self-medicating because of mental health concerns.  [name] was understanding of the policy, however, states that "I don't want CPS to take my children."  CSW discussed what she can expect from CPS involvement, should baby's UDS come back with a positive result.  [name] denies all other substance use.  She also states she is never around her children when she is smoking marijuana.   CSW asked how [name] felt during the postpartum time periods following her previous deliveries.  [name] denies any symptoms of  PMADs, but acknowledges struggling with anxiety and depression at various times in life.  She states she has been off of medication for "at least 3 years," and thinks one of the medications might have been Lithium.  CSW asked if she has been diagnosed with Bipolar and she states she has.  She reports that it has been a while since she has experienced a period of mania or depression.  She described feeling overwhelmed with parenting and life in general at times, but did not suggest that she is suffering from symptoms of Bipolar or Anxiety currently.  [name] was willing to take information about mental health resources from CSW and states she would consider talking with a counselor if she felt it was needed.  CSW provided her with walk-in-clinic information for [facility] as well as [facility] [employer]  She seemed appreciative.  CSW also encouraged her to monitor her mental health with the use of the [place] Postnatal Depression screen and the New Mom Checklist from [employer] and identify a medical professional with whom she feels comfortable talking if symptoms arise.  [name] states she feels comfortable [facility] and commits to reaching out to her if concerns arise.  CSW thanked [name] for talking with CSW today.  [name] stated appreciation for the visit and reports no questions, concerns or needs at this time.  CSW Plan/Description:  No Further Intervention Required/No Barriers to Discharge, Sudden Infant Death Syndrome (SIDS) Education, Perinatal Mood and Anxiety Disorder (PMADs) Education, White, Other Information/Referral to [employer], CSW Will Continue to Monitor Umbilical Cord Tissue Drug Screen Results and Make Report if Barnetta Chapel 06/24/2018, 10:06 PM

## 2018-06-25 NOTE — Addendum Note (Signed)
Addendum  created 06/25/18 1940 by Bethena Midgetddono, Dalaina Tates, MD   Attestation recorded in Intraprocedure, Intraprocedure Attestations filed

## 2018-06-25 NOTE — Progress Notes (Signed)
Daily Post Partum Note  06/25/2018 Shawna Hunt is a 32 y.o. [id number] POD#2 s/p rLTCS (EBL 945mL) @ 6152w0d-->pt received txa for bleeding immediately post op  Pregnancy c/b BMI 30s 24hr/overnight events:  none  Subjective:  Meeting all PO goals, including flatus  Objective:    Current Vital Signs 24h Vital Sign Ranges  T 98 F (36.7 C) Temp  Avg: 98.2 F (36.8 C)  Min: 98 F (36.7 C)  Max: 98.6 F (37 C)  BP 111/61 BP  Min: 103/55  Max: 120/73  HR (!) 55 Pulse  Avg: 56.8  Min: 54  Max: 63  RR 18 Resp  Avg: 18.3  Min: 18  Max: 19  SaO2 99 % Room Air SpO2  Avg: 98.3 %  Min: 97 %  Max: 99 %       24 Hour I/O Current Shift I/O  Time Ins Outs No intake/output data recorded. No intake/output data recorded.    General: NAD Abdomen: +BS, soft, NTTP, ND. Firm fundus below the umbilicus. Incision dressing c/d/i Perineum: deferred Skin:  Warm and dry.  Cardiovascular: S1, S2 normal, no murmur, rub or gallop, regular rate and rhythm Respiratory:  Clear to auscultation bilateral. Normal respiratory effort Extremities: no c/c/e  Medications Current Facility-Administered Medications  Medication Dose Route Frequency Provider Last Rate Last Dose  . acetaminophen (TYLENOL) tablet 650 mg  650 mg Oral Q4H PRN Kathrynn RunningWouk, Noah Bedford, MD   650 mg at 06/24/18 2053  . coconut oil  1 application Topical PRN Kathrynn RunningWouk, Noah Bedford, MD   1 application at 06/24/18 1527  . witch hazel-glycerin (TUCKS) pad 1 application  1 application Topical PRN Wouk, Wilfred CurtisNoah Bedford, MD       And  . dibucaine (NUPERCAINAL) 1 % rectal ointment 1 application  1 application Rectal PRN Wouk, Wilfred CurtisNoah Bedford, MD      . diphenhydrAMINE (BENADRYL) capsule 25 mg  25 mg Oral Q6H PRN Wouk, Wilfred CurtisNoah Bedford, MD      . diphenhydrAMINE (BENADRYL) injection 12.5 mg  12.5 mg Intravenous Q4H PRN Bethena Midgetddono, Ernest, MD       Or  . diphenhydrAMINE (BENADRYL) capsule 25 mg  25 mg Oral Q4H PRN Bethena Midgetddono, Ernest, MD      . enoxaparin (LOVENOX) injection  60 mg  60 mg Subcutaneous Q24H Kathrynn RunningWouk, Noah Bedford, MD   60 mg at 06/25/18 0824  . ibuprofen (ADVIL,MOTRIN) tablet 600 mg  600 mg Oral Q6H Wouk, Wilfred CurtisNoah Bedford, MD   600 mg at 06/25/18 1204  . lactated ringers infusion   Intravenous Continuous Wouk, Wilfred CurtisNoah Bedford, MD      . menthol-cetylpyridinium (CEPACOL) lozenge 3 mg  1 lozenge Oral Q2H PRN Wouk, Wilfred CurtisNoah Bedford, MD      . nalbuphine (NUBAIN) injection 5 mg  5 mg Intravenous Q4H PRN Bethena Midgetddono, Ernest, MD       Or  . nalbuphine (NUBAIN) injection 5 mg  5 mg Subcutaneous Q4H PRN Bethena Midgetddono, Ernest, MD      . nalbuphine (NUBAIN) injection 5 mg  5 mg Intravenous Once PRN Bethena Midgetddono, Ernest, MD       Or  . nalbuphine (NUBAIN) injection 5 mg  5 mg Subcutaneous Once PRN Bethena Midgetddono, Ernest, MD      . naloxone [facility](NARCAN) injection 0.4 mg  0.4 mg Intravenous PRN Bethena Midgetddono, Ernest, MD       And  . sodium chloride flush (NS) 0.9 % injection 3 mL  3 mL Intravenous PRN Bethena Midgetddono, Ernest, MD      .  naloxone HCl (NARCAN) 2 mg in dextrose 5 % 250 mL infusion  1-4 mcg/kg/hr Intravenous Continuous PRN Bethena Midgetddono, Ernest, MD      . ondansetron [facility](ZOFRAN) injection 4 mg  4 mg Intravenous Q8H PRN Bethena Midgetddono, Ernest, MD      . oxyCODONE (Oxy IR/ROXICODONE) immediate release tablet 10 mg  10 mg Oral Q4H PRN Wouk, Wilfred CurtisNoah Bedford, MD      . oxyCODONE (Oxy IR/ROXICODONE) immediate release tablet 5 mg  5 mg Oral Q4H PRN Kathrynn RunningWouk, Noah Bedford, MD   5 mg at 06/25/18 0341  . prenatal multivitamin tablet 1 tablet  1 tablet Oral Q1200 Kathrynn RunningWouk, Noah Bedford, MD   1 tablet at 06/25/18 1204  . scopolamine (TRANSDERM-SCOP) 1 MG/3DAYS 1.5 mg  1 patch Transdermal Once Bethena Midgetddono, Ernest, MD      . senna-docusate (Senokot-S) tablet 2 tablet  2 tablet Oral Q24H Kathrynn RunningWouk, Noah Bedford, MD   2 tablet at 06/24/18 2346  . simethicone (MYLICON) chewable tablet 80 mg  80 mg Oral TID PC Wouk, Wilfred CurtisNoah Bedford, MD   80 mg at 06/25/18 1204  . simethicone (MYLICON) chewable tablet 80 mg  80 mg Oral Q24H Kathrynn RunningWouk, Noah Bedford, MD   80 mg at 06/24/18 2346   . simethicone (MYLICON) chewable tablet 80 mg  80 mg Oral PRN Wouk, Wilfred CurtisNoah Bedford, MD      . Tdap (BOOSTRIX) injection 0.5 mL  0.5 mL Intramuscular Once Wouk, Wilfred CurtisNoah Bedford, MD      . zolpidem [facility](AMBIEN) tablet 5 mg  5 mg Oral QHS PRN Kathrynn RunningWouk, Noah Bedford, MD        Labs:  Recent Labs  Lab 06/22/18 1020 06/23/18 1510 06/24/18 0546  WBC 6.6 11.2* 10.5  HGB 9.5* 10.9* 8.8*  HCT 28.8* 33.5* 25.4*  PLT 212 232 196   Recent Labs  Lab 06/23/18 1510  CREATININE 0.40*    Assessment & Plan:  Pt doing well *Postpartum/postop: routine care. Lovenox. B POS. [facility]. Formula. Female *Anemia: continue iron. No s/s *Dispo: likely tomorrow  [place] Bingharlie Corrin Sieling, Montez HagemanJr. MD Attending Center [facility](Faculty Practice)

## 2018-06-26 MED ORDER — OXYCODONE-ACETAMINOPHEN 5-325 MG PO TABS: 1.0000 | ORAL_TABLET | Freq: Four times a day (QID) | ORAL | 0 refills | Status: DC | PRN

## 2018-06-26 NOTE — Lactation Note (Signed)
This note was copied from a baby's chart. Lactation Consultation Note  Patient Name: Girl Darcus PesterJessica Frith ZOXWR'UToday's Date: 06/26/2018 Reason for consult: Follow-up assessment   Baby 71 hours old.  < 6 lbs. 39 weeks latched upon entering with #20NS with intermittent swallows.  Mother is using nipple shield for sore/cracked nipples. Baby came unlatched and nipple shield full of transitional breastmilk. Mother compressing breast during feeding. Recommend she post pump 4-5 times per day due to using nipple shield and give extra volume back to baby. Mother has manual pump for home use.  Recommend 10 min per breast.  Mom encouraged to feed baby 8-12 times/24 hours and with feeding cues at least q 3 hours. Mother is using ebm and coconut oil for soreness. Discussed that when she wants to try without NS and nipples have healed to try half way through feeding. Reviewed engorgement care and monitoring voids/stools.      Maternal Data    Feeding Feeding Type: Breast Fed Length of feed: 15 min  LATCH Score Latch: Grasps breast easily, tongue down, lips flanged, rhythmical sucking.(latched upon entering)  Audible Swallowing: A few with stimulation  Type of Nipple: Everted at rest and after stimulation  Comfort (Breast/Nipple): Filling, red/small blisters or bruises, mild/mod discomfort  Hold (Positioning): No assistance needed to correctly position infant at breast.  LATCH Score: 8  Interventions Interventions: Hand pump  Lactation Tools Discussed/Used Tools: Nipple Shields Nipple shield size: 20   Consult Status Consult Status: Follow-up Date: 06/27/18 Follow-up type: In-patient    Dahlia ByesBerkelhammer, Ruth [facility] Center[facility] 06/26/2018, 9:52 AM

## 2018-06-26 NOTE — Discharge Instructions (Signed)

## 2018-06-26 NOTE — Discharge Summary (Signed)
OB Discharge Summary  Patient Name: Shawna Hunt DOB: 12/03/1985 MRN: [id number]  Date of admission: 06/23/2018 Delivering MD: Shonna ChockWOUK, NOAH BEDFORD   Date of discharge: 06/26/2018  Admitting diagnosis: RCS Intrauterine pregnancy: 828w0d     Secondary diagnosis:Active Problems:   Status post repeat low transverse cesarean section  Additional problems: Morbid Obesity Acute blood loss anemia     Discharge diagnosis: Term Pregnancy Delivered and Anemia                                                                     Post partum procedures:TXA for bleeding postoperatively  Complications: Hemorrhage>106700mL  Hospital course:  Sceduled C/S   32 y.o. yo [id number] at 468w0d was admitted to the hospital 06/23/2018 for scheduled cesarean section with the following indication:Elective Repeat.  Membrane Rupture Time/Date: 10:44 AM ,06/23/2018   Patient delivered a Viable infant.06/23/2018  Details of operation can be found in separate operative note.  Some bleeding postoperatively lead to TXA being given. Pateint then had an uncomplicated postpartum course. She was given Lovenox during hospitalization. She is ambulating, tolerating a regular diet, passing flatus, and urinating well. Patient is discharged home in stable condition on  06/26/18         Physical exam  Vitals:   06/25/18 0544 06/25/18 1542 06/25/18 2300 06/26/18 0551  BP: 111/61 133/81 126/73 130/79  Pulse: (!) 55 (!) 55 (!) 55 (!) 53  Resp: 18 18 18 18   Temp: 98 F (36.7 C) 98.5 F (36.9 C) 98.4 F (36.9 C) 98.2 F (36.8 C)  TempSrc: Oral Oral Oral Oral  SpO2: 99%   100%  Weight:      Height:       General: alert, cooperative and no distress Lochia: appropriate Uterine Fundus: firm Incision: Dressing is clean, dry, and intact DVT Evaluation: No evidence of DVT seen on physical exam. Labs: Lab Results  Component Value Date   WBC 10.5 06/24/2018   HGB 8.8 (L) 06/24/2018   HCT 25.4 (L) 06/24/2018   MCV 72.2 (L)  06/24/2018   PLT 196 06/24/2018   CMP Latest Ref Rng & Units 06/23/2018  Glucose 65 - 104 mg/dL -  BUN 6 - 23 mg/dL -  Creatinine 1.470.44 - 8.291.00 mg/dL 5.62(Z0.40(L)  Sodium 308135 - 657145 mEq/L -  Potassium 3.5 - 5.1 mEq/L -  Chloride 96 - 112 mEq/L -  CO2 19 - 32 mEq/L -  Calcium 8.4 - 10.5 mg/dL -  Total Protein 6.0 - 8.3 g/dL -  Total Bilirubin 0.3 - 1.2 mg/dL -  Alkaline Phos 39 - 846117 U/L -  AST 0 - 37 U/L -  ALT 0 - 35 U/L -    Discharge instruction: per After Visit Summary and "Baby and Me Booklet".  After Visit Meds:  Allergies as of 06/26/2018   No Known Allergies     Medication List    TAKE these medications   oxyCODONE-acetaminophen 5-325 MG tablet Commonly known as:  PERCOCET/ROXICET Take 1-2 tablets by mouth every 6 (six) hours as needed.   prenatal multivitamin Tabs tablet Take 1 tablet by mouth daily at 12 noon.       Diet: routine diet  Activity: Advance as tolerated. Pelvic rest  for 6 weeks.   Outpatient follow up:6 weeks Follow up Appt:No future appointments. Follow up visit: No follow-ups on file.  Postpartum contraception: IUD Mirena  Newborn Data: Live born female  Birth Weight: 6 lb 2.2 oz (2784 g) APGAR: 8, 9  Newborn Delivery   Birth date/time:  06/23/2018 10:45:00 Delivery type:  C-Section, Low Transverse Trial of labor:  No C-section categorization:  Repeat     Baby Feeding: Breast Disposition:home with mother   06/26/2018 Shawna Boresanya S Gillermo Poch, MD

## 2018-07-13 ENCOUNTER — Encounter: Payer: Self-pay | Admitting: Family Medicine

## 2018-07-13 ENCOUNTER — Ambulatory Visit: Payer: Medicare Other

## 2018-07-13 VITALS — BP 139/87 | HR 64 | Wt 250.8 lb

## 2018-07-13 DIAGNOSIS — Z5189 Encounter for other specified aftercare: Secondary | ICD-10-CM

## 2018-07-13 NOTE — Progress Notes (Signed)
Pt here today for incision check s/p rpt c-section on 06/23/18.  Pt denies any sx of infection.   Incision well approximated, no drainage, no odor, and no erythema.  Pt stated that she has a pp visit scheduled at [facility] on 08/04/18 @ 0800.  Pt did not have any other questions or concerns.

## 2018-09-19 ENCOUNTER — Emergency Department (HOSPITAL_COMMUNITY)
Admission: EM | Admit: 2018-09-19 | Discharge: 2018-09-20 | Disposition: A | Discharge status: Home / Self Care | Payer: Medicare Other | Attending: Emergency Medicine | Admitting: Emergency Medicine

## 2018-09-19 ENCOUNTER — Encounter (HOSPITAL_COMMUNITY): Payer: Self-pay

## 2018-09-19 DIAGNOSIS — K529 Noninfective gastroenteritis and colitis, unspecified: Secondary | ICD-10-CM

## 2018-09-19 DIAGNOSIS — Z87891 Personal history of nicotine dependence: Secondary | ICD-10-CM | POA: Diagnosis not present

## 2018-09-19 DIAGNOSIS — Z79899 Other long term (current) drug therapy: Secondary | ICD-10-CM | POA: Diagnosis not present

## 2018-09-19 DIAGNOSIS — R112 Nausea with vomiting, unspecified: Secondary | ICD-10-CM | POA: Diagnosis present

## 2018-09-19 LAB — COMPREHENSIVE METABOLIC PANEL
ALBUMIN: 3.5 g/dL (ref 3.5–5.0)
ALK PHOS: 50 U/L (ref 38–126)
ALT: 18 U/L (ref 0–44)
AST: 17 U/L (ref 15–41)
Anion gap: 6 (ref 5–15)
BILIRUBIN TOTAL: 0.2 mg/dL — AB (ref 0.3–1.2)
BUN: 13 mg/dL (ref 6–20)
CO2: 25 mmol/L (ref 22–32)
Calcium: 9 mg/dL (ref 8.9–10.3)
Chloride: 107 mmol/L (ref 98–111)
Creatinine, Ser: 0.67 mg/dL (ref 0.44–1.00)
GFR calc Af Amer: >60 mL/min (ref >60)
GFR calc non Af Amer: >60 mL/min (ref >60)
GLUCOSE: 127 mg/dL — AB (ref 70–99)
POTASSIUM: 3.8 mmol/L (ref 3.5–5.1)
Sodium: 138 mmol/L (ref 135–145)
TOTAL PROTEIN: 7 g/dL (ref 6.5–8.1)

## 2018-09-19 LAB — CBC
HCT: 38.1 % (ref 36.0–46.0)
HEMOGLOBIN: 11.4 g/dL — AB (ref 12.0–15.0)
MCH: 22.7 pg — AB (ref 26.0–34.0)
MCHC: 29.9 g/dL — ABNORMAL LOW (ref 30.0–36.0)
MCV: 75.7 fL — AB (ref 80.0–100.0)
PLATELETS: 297 10*3/uL (ref 150–400)
RBC: 5.03 MIL/uL (ref 3.87–5.11)
RDW: 15.2 % (ref 11.5–15.5)
WBC: 10 10*3/uL (ref 4.0–10.5)
nRBC: 0 % (ref 0.0–0.2)

## 2018-09-19 LAB — I-STAT BETA HCG BLOOD, ED (MC, WL, AP ONLY)

## 2018-09-19 LAB — LIPASE, BLOOD: Lipase: 25 U/L (ref 11–51)

## 2018-09-19 NOTE — ED Triage Notes (Signed)
Pt coming from home. Pt states yesterday pt was having nausea with vomiting and diarrhea. Pt states she also feels weak.

## 2018-09-19 NOTE — ED Provider Notes (Signed)
[facility] EMERGENCY DEPARTMENT Provider Note   CSN: [id number] Arrival date & time: 09/19/18  2304     History   Chief Complaint Chief Complaint  Patient presents with  . Nausea  . Weakness    HPI Shawna Hunt is a 32 y.o. female.  Patient presents to the emergency department for evaluation of nausea and vomiting.  She reports that she became ill yesterday and had nausea, vomiting and diarrhea all day yesterday.  She reports multiple episodes of emesis with small amounts of watery diarrhea.  Vomiting has stopped today, but she has not been eating or drinking much.  She has felt very weak today.  She denies any abdominal pain.  She has not had a fever.  She denies upper respiratory infection symptoms.  No sick contacts.  She thinks it might of started after she ate something.     Past Medical History:  Diagnosis Date  . Anemia   . Anxiety   . Depression    was taking a medicine a year ago doesn't remember the name  . Medical history non-contributory   . Ovarian cyst     Patient Active Problem List   Diagnosis Date Noted  . Status post repeat low transverse cesarean section 06/23/2018  . History of cesarean delivery 04/08/2018  . Round ligament pain 02/12/2018  . Bacterial vaginitis 02/12/2018  . Cesarean delivery delivered 07/03/2016  . Group B Streptococcus carrier, +RV culture, currently pregnant 06/23/2016  . Iron deficiency anemia of pregnancy 04/15/2016  . Poor weight gain of pregnancy 03/28/2016  . Previous cesarean deliveryx 2, antepartum 12/27/2015  . Obesity in pregnancy, antepartum 12/27/2015  . Substance abuse (HCC) 11/26/2015  . Supervision of normal pregnancy, antepartum 11/17/2015    Past Surgical History:  Procedure Laterality Date  . CESAREAN SECTION     two  . CESAREAN SECTION N/A 07/02/2016   Procedure: CESAREAN SECTION;  Surgeon: Willodean Rosenthal, MD;  Location: [facility] BIRTHING SUITES;  Service: Obstetrics;   Laterality: N/A;  . CESAREAN SECTION N/A 06/23/2018   Procedure: REPEAT CESAREAN SECTION;  Surgeon: Kathrynn Running, MD;  Location: [facility] BIRTHING SUITES;  Service: Obstetrics;  Laterality: N/A;  . HERNIA REPAIR       OB History    Gravida  4   Para  4   Term  4   Preterm  0   AB  0   Living  4     SAB  0   TAB  0   Ectopic  0   Multiple  0   Live Births  4            Home Medications    Prior to Admission medications   Medication Sig Start Date End Date Taking? Authorizing Provider  ondansetron (ZOFRAN) 4 MG tablet Take 1 tablet (4 mg total) by mouth every 6 (six) hours. 09/20/18   Gilda Crease, MD  Prenatal Vit-Fe Fumarate-FA (PRENATAL MULTIVITAMIN) TABS tablet Take 1 tablet by mouth daily at 12 noon.    [provider]    Family History Family History  Problem Relation Age of Onset  . Hypertension Mother   . Hypertension Father   . Hypertension Maternal Aunt   . Diabetes Maternal Aunt   . Hypertension Paternal Aunt   . Hearing loss Son     Social History Social History   Tobacco Use  . Smoking status: Former Smoker    Last attempt to quit: 12/19/2013  Years since quitting: 4.7  . Smokeless tobacco: Never Used  Substance Use Topics  . Alcohol use: Yes  . Drug use: Yes    Frequency: 3.0 times per week    Types: Marijuana    Comment: positive drug screen July 2019     Allergies   Patient has no known allergies.   Review of Systems Review of Systems  Constitutional: Positive for fatigue.  Gastrointestinal: Positive for diarrhea, nausea and vomiting.  All other systems reviewed and are negative.    Physical Exam Updated Vital Signs BP (!) 100/58   Pulse 71   Temp 98 F (36.7 C) (Oral)   Resp 18   Ht 5\' 8"  (1.727 m)   Wt 108.9 kg   SpO2 99%   BMI 36.49 kg/m   Physical Exam  Constitutional: She is oriented to person, place, and time. She appears well-developed and well-nourished. No distress.  HENT:    Head: Normocephalic and atraumatic.  Right Ear: Hearing normal.  Left Ear: Hearing normal.  Nose: Nose normal.  Mouth/Throat: Oropharynx is clear and moist and mucous membranes are normal.  Eyes: Pupils are equal, round, and reactive to light. Conjunctivae and EOM are normal.  Neck: Normal range of motion. Neck supple.  Cardiovascular: Regular rhythm, S1 normal and S2 normal. Exam reveals no gallop and no friction rub.  No murmur heard. Pulmonary/Chest: Effort normal and breath sounds normal. No respiratory distress. She exhibits no tenderness.  Abdominal: Soft. Normal appearance and bowel sounds are normal. There is no hepatosplenomegaly. There is no tenderness. There is no rebound, no guarding, no tenderness at McBurney's point and negative Murphy's sign. No hernia.  Musculoskeletal: Normal range of motion.  Neurological: She is alert and oriented to person, place, and time. She has normal strength. No cranial nerve deficit or sensory deficit. Coordination normal. GCS eye subscore is 4. GCS verbal subscore is 5. GCS motor subscore is 6.  Skin: Skin is warm, dry and intact. No rash noted. No cyanosis.  Psychiatric: She has a normal mood and affect. Her speech is normal and behavior is normal. Thought content normal.  Nursing note and vitals reviewed.    ED Treatments / Results  Labs (all labs ordered are listed, but only abnormal results are displayed) Labs Reviewed  COMPREHENSIVE METABOLIC PANEL - Abnormal; Notable for the following components:      Result Value   Glucose, Bld 127 (*)    Total Bilirubin 0.2 (*)    All other components within normal limits  CBC - Abnormal; Notable for the following components:   Hemoglobin 11.4 (*)    MCV 75.7 (*)    MCH 22.7 (*)    MCHC 29.9 (*)    All other components within normal limits  URINALYSIS, ROUTINE W REFLEX MICROSCOPIC - Abnormal; Notable for the following components:   APPearance HAZY (*)    All other components within normal  limits  LIPASE, BLOOD  I-STAT BETA HCG BLOOD, ED ([facility], [facility], [facility] ONLY)    EKG None  Radiology No results found.  Procedures Procedures (including critical care time)  Medications Ordered in ED Medications - No data to display   Initial Impression / Assessment and Plan / ED Course  I have reviewed the triage vital signs and the nursing notes.  Pertinent labs & imaging results that were available during my care of the patient were reviewed by me and considered in my medical decision making (see chart for details).     Presents to  the emergency department for evaluation of generalized weakness that has occurred after she had nausea and vomiting over the course of yesterday.  She had some associated diarrhea.  She has not been expensing abdominal pain and has benign abdominal exam tonight, no tenderness on examination.  Lab work is reassuring.  Patient administered IV fluids and has had improvement.  Patient reassured, can be discharged with continued oral hydration and symptomatic treatment.  Final Clinical Impressions(s) / ED Diagnoses   Final diagnoses:  Gastroenteritis    ED Discharge Orders         Ordered    ondansetron (ZOFRAN) 4 MG tablet  Every 6 hours,   Status:  Discontinued     09/20/18 0111    ondansetron (ZOFRAN) 4 MG tablet  Every 6 hours     09/20/18 0112           Gilda Crease, MD 09/20/18 [phone]

## 2018-09-20 LAB — URINALYSIS, ROUTINE W REFLEX MICROSCOPIC
Bilirubin Urine: NEGATIVE
Glucose, UA: NEGATIVE mg/dL
Hgb urine dipstick: NEGATIVE
Ketones, ur: NEGATIVE mg/dL
LEUKOCYTES UA: NEGATIVE
NITRITE: NEGATIVE
PH: 5 (ref 5.0–8.0)
Protein, ur: NEGATIVE mg/dL
SPECIFIC GRAVITY, URINE: 1.028 (ref 1.005–1.030)

## 2018-09-20 MED ORDER — ONDANSETRON HCL 4 MG PO TABS: 4.0000 mg | ORAL_TABLET | Freq: Four times a day (QID) | ORAL | 0 refills | Status: DC

## 2018-09-20 MED ORDER — ONDANSETRON HCL 4 MG PO TABS: 4.0000 mg | ORAL_TABLET | Freq: Four times a day (QID) | ORAL | 0 refills | Status: AC

## 2020-04-14 ENCOUNTER — Emergency Department (HOSPITAL_COMMUNITY): Payer: No Typology Code available for payment source

## 2020-04-14 ENCOUNTER — Other Ambulatory Visit: Payer: Self-pay

## 2020-04-14 ENCOUNTER — Encounter (HOSPITAL_COMMUNITY): Payer: Self-pay

## 2020-04-14 ENCOUNTER — Emergency Department (HOSPITAL_COMMUNITY)
Admission: EM | Admit: 2020-04-14 | Discharge: 2020-04-14 | Disposition: A | Discharge status: Home / Self Care | Payer: No Typology Code available for payment source | Attending: Emergency Medicine | Admitting: Emergency Medicine

## 2020-04-14 DIAGNOSIS — M25512 Pain in left shoulder: Secondary | ICD-10-CM | POA: Insufficient documentation

## 2020-04-14 DIAGNOSIS — Y939 Activity, unspecified: Secondary | ICD-10-CM | POA: Insufficient documentation

## 2020-04-14 DIAGNOSIS — Y999 Unspecified external cause status: Secondary | ICD-10-CM | POA: Insufficient documentation

## 2020-04-14 DIAGNOSIS — S8011XA Contusion of right lower leg, initial encounter: Secondary | ICD-10-CM | POA: Diagnosis not present

## 2020-04-14 DIAGNOSIS — S9031XA Contusion of right foot, initial encounter: Secondary | ICD-10-CM | POA: Insufficient documentation

## 2020-04-14 DIAGNOSIS — Y929 Unspecified place or not applicable: Secondary | ICD-10-CM | POA: Insufficient documentation

## 2020-04-14 DIAGNOSIS — M79661 Pain in right lower leg: Secondary | ICD-10-CM | POA: Diagnosis present

## 2020-04-14 DIAGNOSIS — M7918 Myalgia, other site: Secondary | ICD-10-CM

## 2020-04-14 MED ORDER — NAPROXEN 250 MG PO TABS
500.0000 mg | ORAL_TABLET | Freq: Once | ORAL | Status: AC
  Administered 2020-04-14: 500 mg via ORAL
  Filled 2020-04-14: qty 2

## 2020-04-14 MED ORDER — NAPROXEN 500 MG PO TABS: 500.0000 mg | ORAL_TABLET | Freq: Two times a day (BID) | ORAL | 0 refills | Status: AC
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
## 2020-07-09 ENCOUNTER — Emergency Department (HOSPITAL_COMMUNITY): Payer: Medicare Other

## 2020-07-09 ENCOUNTER — Other Ambulatory Visit: Payer: Self-pay

## 2020-07-09 ENCOUNTER — Emergency Department (HOSPITAL_COMMUNITY)
Admission: EM | Admit: 2020-07-09 | Discharge: 2020-07-09 | Disposition: A | Discharge status: Home / Self Care | Payer: Medicare Other | Attending: Emergency Medicine | Admitting: Emergency Medicine

## 2020-07-09 DIAGNOSIS — R102 Pelvic and perineal pain: Secondary | ICD-10-CM | POA: Insufficient documentation

## 2020-07-09 DIAGNOSIS — Z87891 Personal history of nicotine dependence: Secondary | ICD-10-CM | POA: Diagnosis not present

## 2020-07-09 DIAGNOSIS — R0789 Other chest pain: Secondary | ICD-10-CM

## 2020-07-09 DIAGNOSIS — U071 COVID-19: Secondary | ICD-10-CM | POA: Diagnosis not present

## 2020-07-09 DIAGNOSIS — Z79899 Other long term (current) drug therapy: Secondary | ICD-10-CM | POA: Insufficient documentation

## 2020-07-09 LAB — CBC
HCT: 40.2 % (ref 36.0–46.0)
Hemoglobin: 12.3 g/dL (ref 12.0–15.0)
MCH: 22.9 pg — ABNORMAL LOW (ref 26.0–34.0)
MCHC: 30.6 g/dL (ref 30.0–36.0)
MCV: 74.9 fL — ABNORMAL LOW (ref 80.0–100.0)
Platelets: 298 10*3/uL (ref 150–400)
RBC: 5.37 MIL/uL — ABNORMAL HIGH (ref 3.87–5.11)
RDW: 15.5 % (ref 11.5–15.5)
WBC: 5 10*3/uL (ref 4.0–10.5)
nRBC: 0 % (ref 0.0–0.2)

## 2020-07-09 LAB — BASIC METABOLIC PANEL
Anion gap: 11 (ref 5–15)
BUN: 9 mg/dL (ref 6–20)
CO2: 22 mmol/L (ref 22–32)
Calcium: 8.4 mg/dL — ABNORMAL LOW (ref 8.9–10.3)
Chloride: 105 mmol/L (ref 98–111)
Creatinine, Ser: 0.79 mg/dL (ref 0.44–1.00)
GFR calc Af Amer: >60 mL/min (ref >60)
GFR calc non Af Amer: >60 mL/min (ref >60)
Glucose, Bld: 151 mg/dL — ABNORMAL HIGH (ref 70–99)
Potassium: 4 mmol/L (ref 3.5–5.1)
Sodium: 138 mmol/L (ref 135–145)

## 2020-07-09 LAB — I-STAT BETA HCG BLOOD, ED (MC, WL, AP ONLY): I-stat hCG, quantitative: <5 m[IU]/mL (ref <5)

## 2020-07-09 LAB — TROPONIN I (HIGH SENSITIVITY)
Troponin I (High Sensitivity): <2 ng/L (ref <18)
Troponin I (High Sensitivity): 2 ng/L (ref <18)

## 2020-07-09 LAB — SARS CORONAVIRUS 2 BY RT PCR (HOSPITAL ORDER, PERFORMED IN ~~LOC~~ HOSPITAL LAB): SARS Coronavirus 2: POSITIVE — AB

## 2020-07-09 NOTE — ED Provider Notes (Signed)
[facility] EMERGENCY DEPARTMENT Provider Note   CSN: [id number] Arrival date & time: 07/09/20  1448     History Chief Complaint  Patient presents with  . Chest Pain    Shawna Hunt is a 34 y.o. female.  HPI  Patient presents with concern of chest pain, tightness, dyspnea, myalgia. Onset was yesterday.  Symptoms have progressed since onset with no relief with anything. No fever, no vomiting, no abdominal pain. Patient notes that she does have a history of suprapubic pain that has been present for some time. Just prior to my speaking with the patient she was informed that a coworker was diagnosed with coronavirus.  She was well prior to the onset of illness, denies substantial medical problems, take medication regularly. She smokes a few cigarettes a day.   Past Medical History:  Diagnosis Date  . Anemia   . Anxiety   . Depression    was taking a medicine a year ago doesn't remember the name  . Medical history non-contributory   . Ovarian cyst     Patient Active Problem List   Diagnosis Date Noted  . Status post repeat low transverse cesarean section 06/23/2018  . History of cesarean delivery 04/08/2018  . Round ligament pain 02/12/2018  . Bacterial vaginitis 02/12/2018  . Cesarean delivery delivered 07/03/2016  . Group B Streptococcus carrier, +RV culture, currently pregnant 06/23/2016  . Iron deficiency anemia of pregnancy 04/15/2016  . Poor weight gain of pregnancy 03/28/2016  . Previous cesarean deliveryx 2, antepartum 12/27/2015  . Obesity in pregnancy, antepartum 12/27/2015  . Substance abuse (HCC) 11/26/2015  . Supervision of normal pregnancy, antepartum 11/17/2015    Past Surgical History:  Procedure Laterality Date  . CESAREAN SECTION     two  . CESAREAN SECTION N/A 07/02/2016   Procedure: CESAREAN SECTION;  Surgeon: Willodean Rosenthal, MD;  Location: [facility] BIRTHING SUITES;  Service: Obstetrics;  Laterality: N/A;  . CESAREAN  SECTION N/A 06/23/2018   Procedure: REPEAT CESAREAN SECTION;  Surgeon: Kathrynn Running, MD;  Location: [facility] BIRTHING SUITES;  Service: Obstetrics;  Laterality: N/A;  . HERNIA REPAIR       OB History    Gravida  4   Para  4   Term  4   Preterm  0   AB  0   Living  4     SAB  0   TAB  0   Ectopic  0   Multiple  0   Live Births  4           Family History  Problem Relation Age of Onset  . Hypertension Mother   . Hypertension Father   . Hypertension Maternal Aunt   . Diabetes Maternal Aunt   . Hypertension Paternal Aunt   . Hearing loss Son     Social History   Tobacco Use  . Smoking status: Former Smoker    Quit date: 12/19/2013    Years since quitting: 6.5  . Smokeless tobacco: Never Used  Substance Use Topics  . Alcohol use: Yes  . Drug use: Yes    Frequency: 3.0 times per week    Types: Marijuana    Comment: positive drug screen July 2019    Home Medications Prior to Admission medications   Medication Sig Start Date End Date Taking? Authorizing Provider  naproxen (NAPROSYN) 500 MG tablet Take 1 tablet (500 mg total) by mouth 2 (two) times daily. 04/14/20   Renne Crigler, PA-C  ondansetron (  ZOFRAN) 4 MG tablet Take 1 tablet (4 mg total) by mouth every 6 (six) hours. 09/20/18   Gilda Crease, MD  Prenatal Vit-Fe Fumarate-FA (PRENATAL MULTIVITAMIN) TABS tablet Take 1 tablet by mouth daily at 12 noon.    [provider]    Allergies    Patient has no known allergies.  Review of Systems   Review of Systems  Constitutional:       Per HPI, otherwise negative  HENT:       Per HPI, otherwise negative  Respiratory:       Per HPI, otherwise negative  Cardiovascular:       Per HPI, otherwise negative  Gastrointestinal: Negative for vomiting.  Endocrine:       Negative aside from HPI  Genitourinary:       Neg aside from HPI   Musculoskeletal:       Per HPI, otherwise negative  Skin: Negative.   Allergic/Immunologic: Negative  for immunocompromised state.  Neurological: Negative for syncope.    Physical Exam Updated Vital Signs BP 120/63 (BP Location: Left Arm)   Pulse 75   Temp 98 F (36.7 C) (Oral)   Resp 16   SpO2 99%   Physical Exam Vitals and nursing note reviewed.  Constitutional:      General: She is not in acute distress.    Appearance: She is well-developed.  HENT:     Head: Normocephalic and atraumatic.  Eyes:     Conjunctiva/sclera: Conjunctivae normal.  Cardiovascular:     Rate and Rhythm: Normal rate and regular rhythm.  Pulmonary:     Effort: Pulmonary effort is normal. No respiratory distress.     Breath sounds: Normal breath sounds. No stridor.  Abdominal:     General: There is no distension.  Skin:    General: Skin is warm and dry.  Neurological:     Mental Status: She is alert and oriented to person, place, and time.     Cranial Nerves: No cranial nerve deficit.     ED Results / Procedures / Treatments   Labs (all labs ordered are listed, but only abnormal results are displayed) Labs Reviewed  BASIC METABOLIC PANEL - Abnormal; Notable for the following components:      Result Value   Glucose, Bld 151 (*)    Calcium 8.4 (*)    All other components within normal limits  CBC - Abnormal; Notable for the following components:   RBC 5.37 (*)    MCV 74.9 (*)    MCH 22.9 (*)    All other components within normal limits  SARS CORONAVIRUS 2 BY RT PCR (HOSPITAL ORDER, PERFORMED IN [facility])  I-STAT BETA HCG BLOOD, ED ([facility], [facility], [facility] ONLY)  TROPONIN I (HIGH SENSITIVITY)  TROPONIN I (HIGH SENSITIVITY)    EKG EKG Interpretation  Date/Time:  Monday July 09 2020 14:56:35 EDT Ventricular Rate:  70 PR Interval:  150 QRS Duration: 78 QT Interval:  408 QTC Calculation: 440 R Axis:   80 Text Interpretation: Normal sinus rhythm Normal ECG Confirmed by Gerhard Munch [phone]) on 07/09/2020 4:45:44 PM   Radiology DG Chest 2 View  Result Date:  07/09/2020 CLINICAL DATA:  Chest pain EXAM: CHEST - 2 VIEW COMPARISON:  04/14/2020 FINDINGS: The heart size and mediastinal contours are within normal limits. Both lungs are clear. The visualized skeletal structures are unremarkable. IMPRESSION: No active cardiopulmonary disease. Electronically Signed   By: Jasmine Pang M.D.   On: 07/09/2020 15:42  Procedures Procedures (including critical care time)  Medications Ordered in ED Medications - No data to display  ED Course  I have reviewed the triage vital signs and the nursing notes.  Pertinent labs & imaging results that were available during my care of the patient were reviewed by me and considered in my medical decision making (see chart for details).  Patient eloped after the initial evaluation.  This adult female, generally well historically, and in no distress here was presenting with chest pain, dyspnea.  Patient's initial studies include EKG, x-ray, labs reassuring, no evidence for ACS, pneumonia.  Given consideration of coworker with coronavirus, testing was pending at the time of patient's elopement, given the absence of distress, as above, patient may have been reasonable for discharge regardless of that result.  Shawna Hunt was evaluated in Emergency Department on 07/09/2020 for the symptoms described in the history of present illness. She was evaluated in the context of the global COVID-19 pandemic, which necessitated consideration that the patient might be at risk for infection with the SARS-CoV-2 virus that causes COVID-19. Institutional protocols and algorithms that pertain to the evaluation of patients at risk for COVID-19 are in a state of rapid change based on information released by regulatory bodies including the CDC and federal and state organizations. These policies and algorithms were followed during the patient's care in the ED.  Final Clinical Impression(s) / ED Diagnoses Final diagnoses:  Atypical chest pain      Gerhard Munch, MD 07/09/20 2006

## 2020-07-09 NOTE — ED Notes (Addendum)
Pt upset about being placed in a hallway bed. Charge nurse spoke with pt and gave her the option to go back to the waiting room and wait for a room. Pt refused and jumped into the chair in the hallway. Pt began cursing loudly and stating "she probably has covid and everyone should be scared." Pt throwing the F word around and stating "we are gonna put her in a room one way or another." This RN and the NT spoke with the pt and asked her not to cuss or speak that way in the hallway. Pt stated "Well then if I had been in a room you wouldn't have to hear all of this." Product/process development scientist notified.

## 2020-07-09 NOTE — ED Notes (Addendum)
Pt continues to be disrespectful to staff and talking about staff as they walk by. This RN again spoke to pt about her language and being disrespectful. Pt stated "Well you shouldn't be listening in on my conversations, you should be finding me a bed instead." This RN informed pt again that the charge nurse gave her, her options and told her that she could go back to the waiting room and wait for a bed to become available. Pt stated "She will not" so this RN informed her again that she could not be disrespectful to staff or continue to talk that way or security would be called. Pt rolled her eyes and huffed and puffed.

## 2020-07-09 NOTE — ED Triage Notes (Signed)
Pt reports she woke up at 2pm today with L sided chest pain and shob. Endorses moving her L arm more than normal recently so is wondering if it is muscular related. Endorses pelvic pain and spotting x 2 days.

## 2020-07-09 NOTE — ED Notes (Signed)
Pt very upset about sitting in hall, Asher Muir RN came to talk to pt. Pt now swearing and saying she will do whatever it takes to be in a room. Asked pt to stop swearing and "she will do whatever the F she wants"

## 2020-07-09 NOTE — ED Notes (Addendum)
Pt upset saying that staff took her soda that she paid $2 for. Pt stated that her soda was in the wheelchair that was used to bring pt back from the waiting room to the hall chair. Wheelchair was taken back to lobby with staff member. The soda was not seen. Pt got up from chair, cussing at staff and found charge RN, Asher Muir. Consulting civil engineer gave pt a coke and asked pt to stop cussing at staff members. Pt continues to cuss and continues to be disrespectful to staff that walks by.

## 2020-07-10 ENCOUNTER — Telehealth: Payer: Self-pay | Admitting: Family

## 2020-07-10 NOTE — Telephone Encounter (Signed)
Called to Discuss with patient about Covid symptoms and the use of the monoclonal antibody infusion for those with mild to moderate Covid symptoms and at a high risk of hospitalization.     Pt appears to qualify for this infusion due to co-morbid conditions and/or a member of an at-risk group in accordance with the FDA Emergency Use Authorization.   Ms. Yeaman was seen on 07/09/20 at [facility] with positive COVID test. Symptoms started 8/29 and included chest pain, chest tightness, dyspnea, and myalgia. Risk factors include BMI >25.   Not currently experiencing symptoms and declines treatment.   Additional information sent via MyChart.  Marcos Eke, NP.

## 2021-12-31 IMAGING — DX DG CHEST 2V
2 series · 2 of 2 positions shown · non-contrast
Comparison: 04/14/2020

CLINICAL DATA: Chest pain

EXAM:
CHEST - 2 VIEW

[w chest pa]
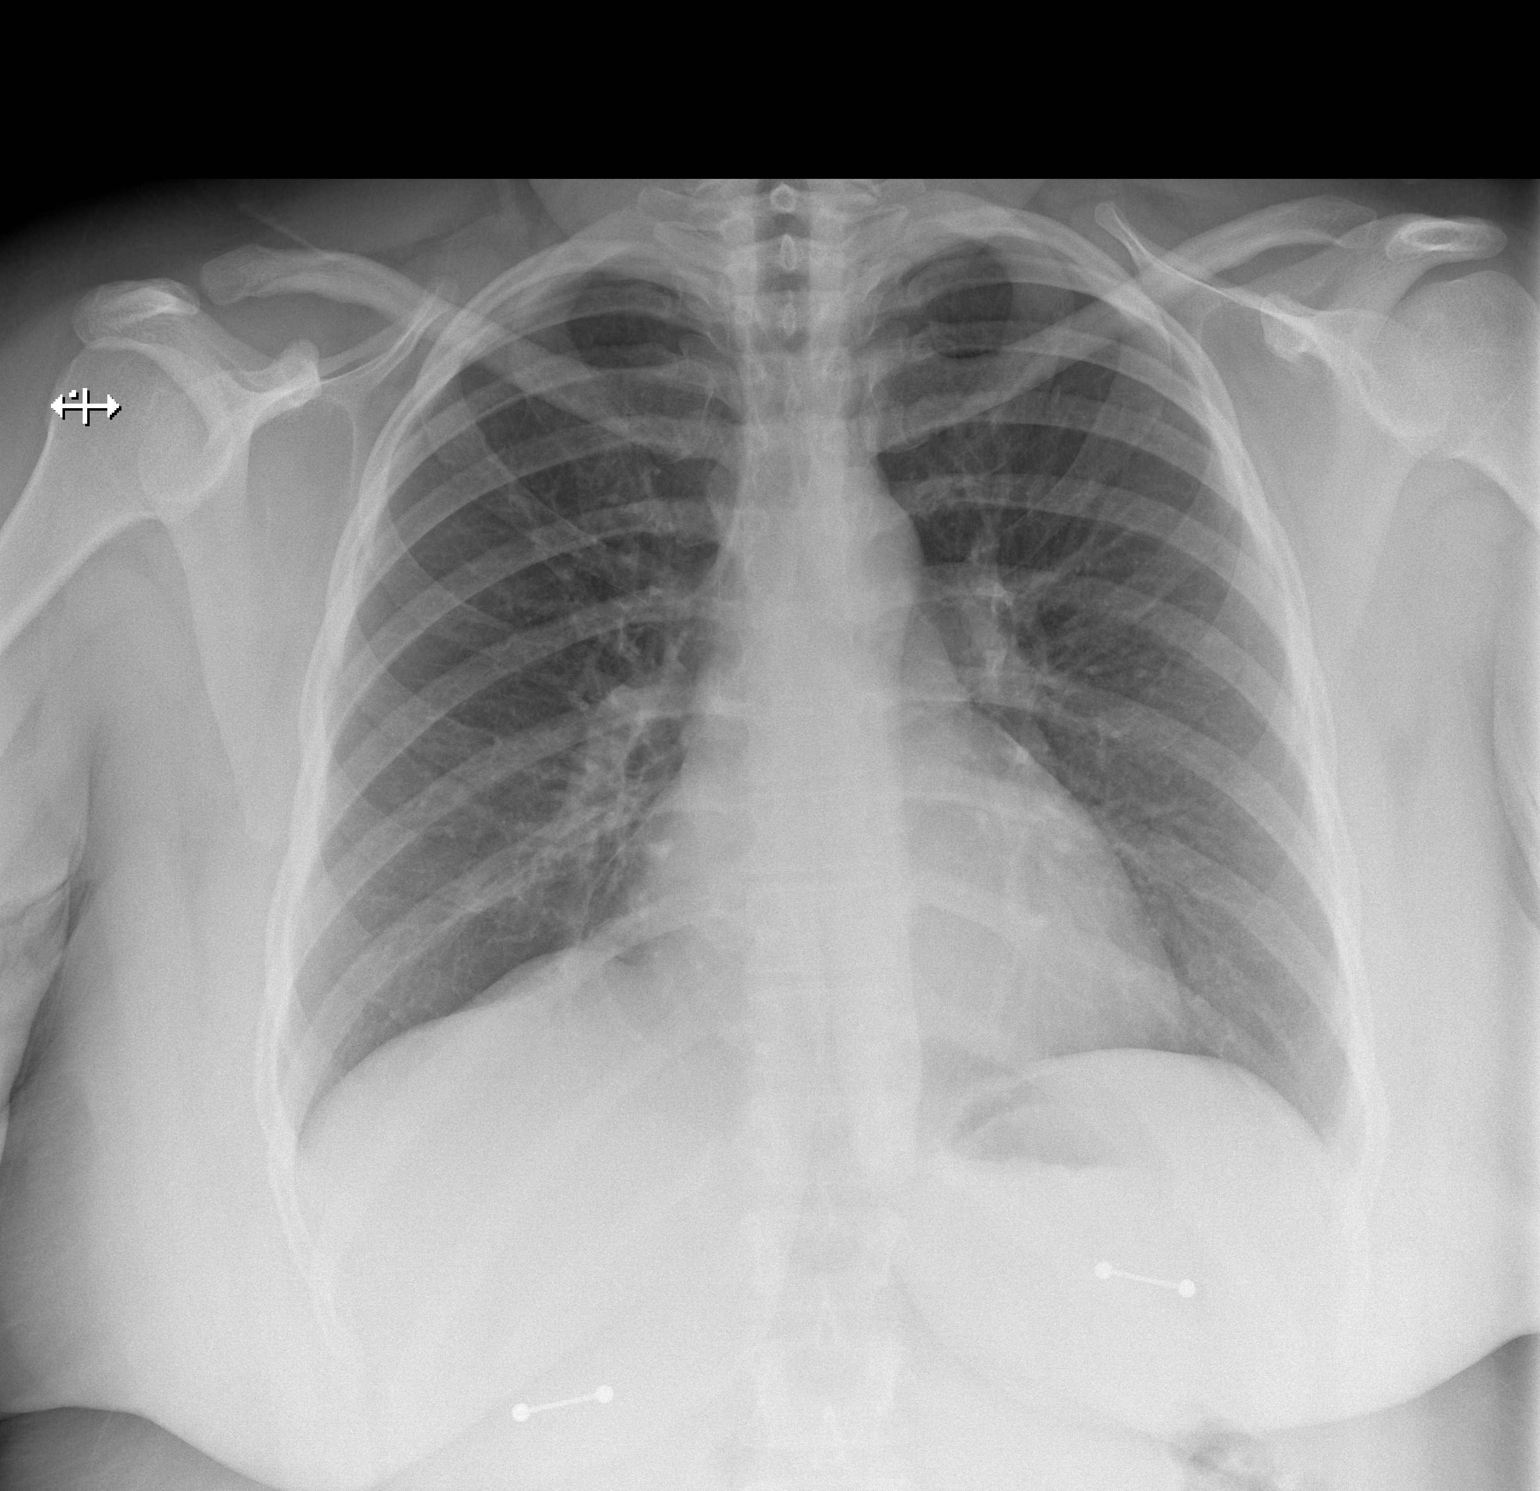

[w chest lat]
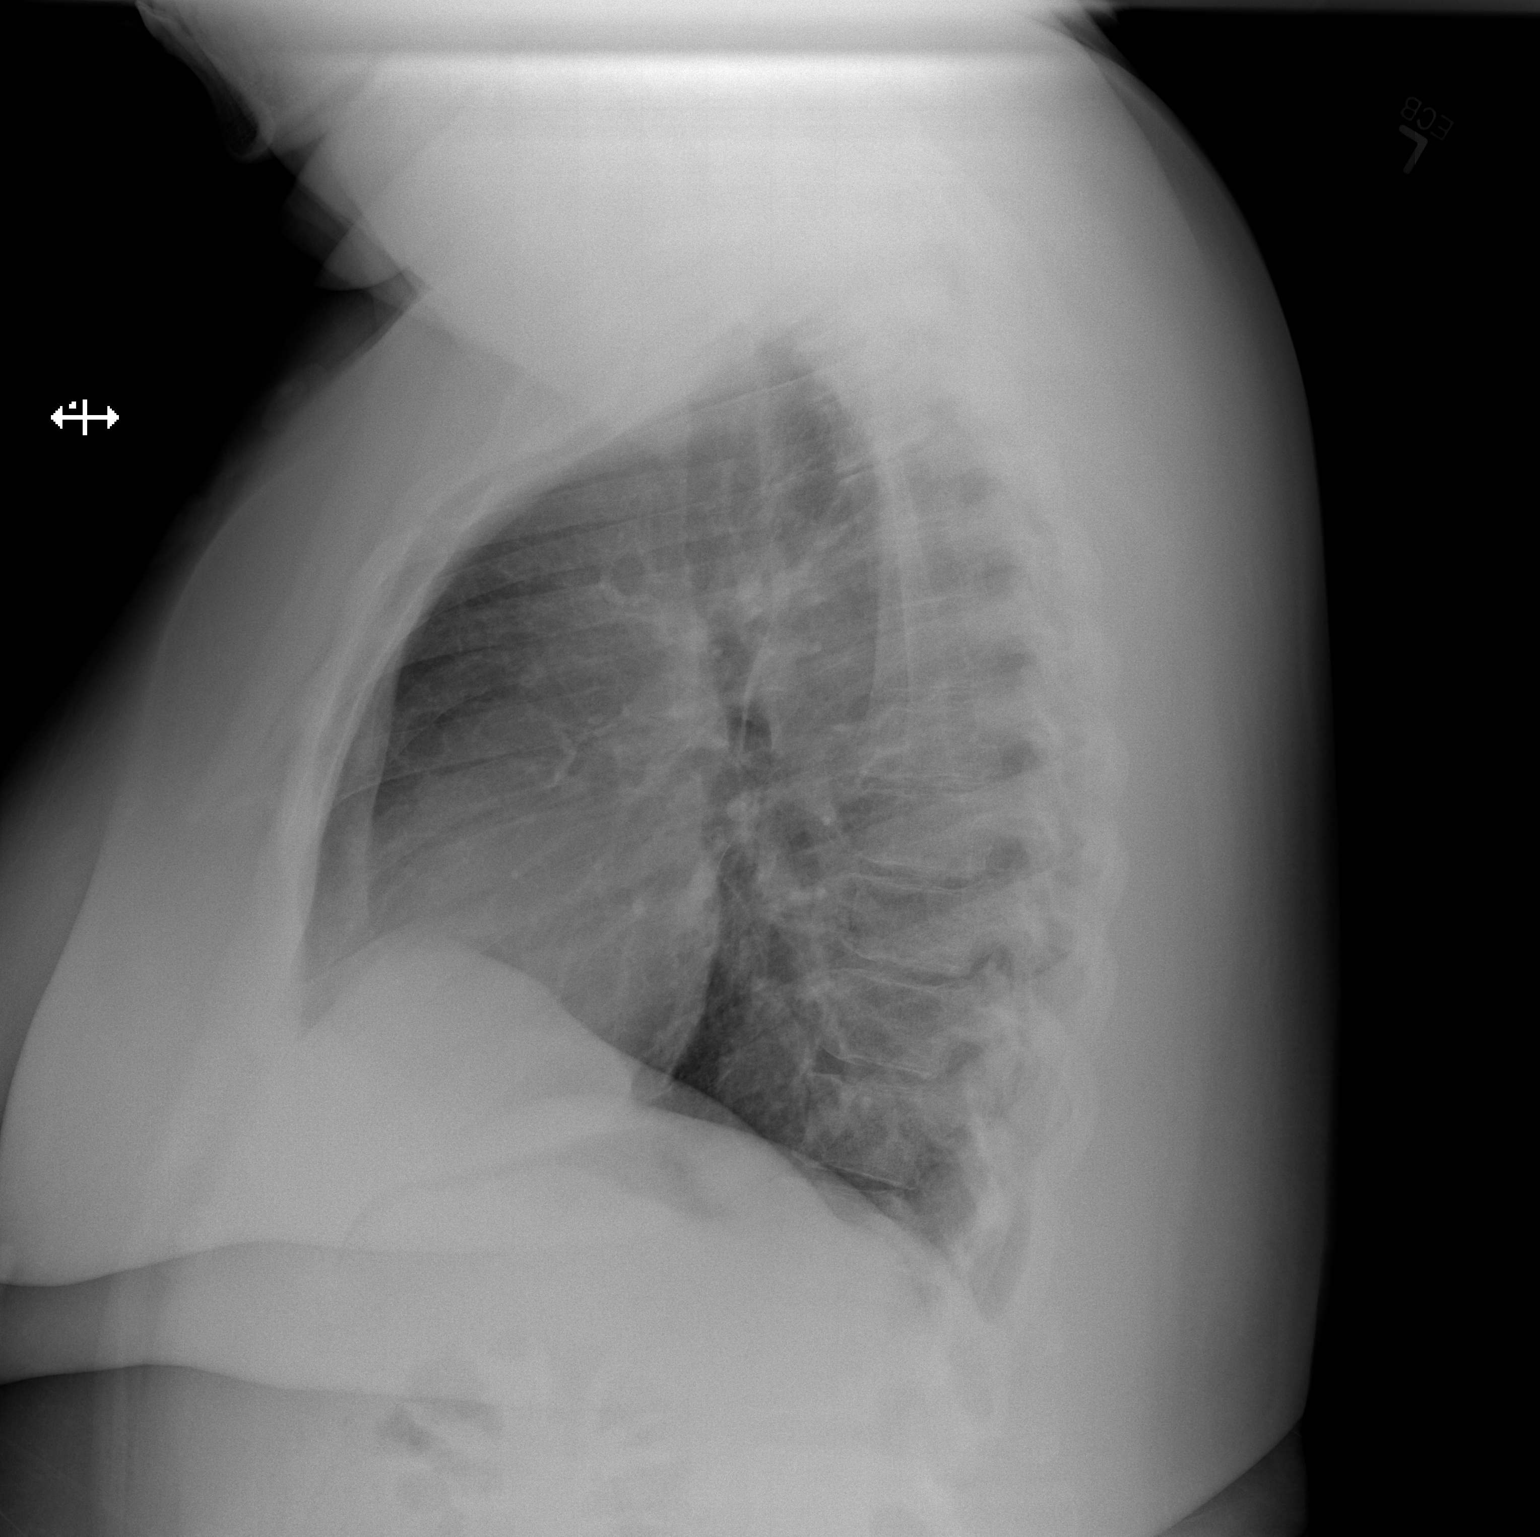

[2 of 2 positions shown; findings below may reference images not displayed]

FINDINGS: The heart size and mediastinal contours are within normal limits.
Both lungs are clear. The visualized skeletal structures are
unremarkable.
IMPRESSION: No active cardiopulmonary disease.
